# Patient Record
Sex: Female | Born: 1985
Health system: Southern US, Community
[De-identification: ages and names within clinical notes are randomized; demographics above are authoritative.]

## PROBLEM LIST (undated history)

## (undated) DIAGNOSIS — L309 Dermatitis, unspecified: Secondary | ICD-10-CM

## (undated) DIAGNOSIS — N83209 Unspecified ovarian cyst, unspecified side: Secondary | ICD-10-CM

## (undated) DIAGNOSIS — N809 Endometriosis, unspecified: Secondary | ICD-10-CM

## (undated) DIAGNOSIS — K8020 Calculus of gallbladder without cholecystitis without obstruction: Secondary | ICD-10-CM

## (undated) DIAGNOSIS — E785 Hyperlipidemia, unspecified: Secondary | ICD-10-CM

## (undated) DIAGNOSIS — N926 Irregular menstruation, unspecified: Secondary | ICD-10-CM

## (undated) DIAGNOSIS — L709 Acne, unspecified: Secondary | ICD-10-CM

## (undated) DIAGNOSIS — R102 Pelvic and perineal pain: Secondary | ICD-10-CM

## (undated) HISTORY — PX: CHOLECYSTECTOMY: SHX55

## (undated) HISTORY — PX: NO PAST SURGERIES: SHX2092

## (undated) HISTORY — DX: Calculus of gallbladder without cholecystitis without obstruction: K80.20

## (undated) HISTORY — DX: Endometriosis, unspecified: N80.9

## (undated) HISTORY — DX: Unspecified ovarian cyst, unspecified side: N83.209

---

## 2009-11-06 ENCOUNTER — Emergency Department (HOSPITAL_COMMUNITY): Admission: EM | Admit: 2009-11-06 | Discharge: 2009-11-06 | Payer: Self-pay | Admitting: Family Medicine

## 2010-05-15 ENCOUNTER — Inpatient Hospital Stay (INDEPENDENT_AMBULATORY_CARE_PROVIDER_SITE_OTHER)
Admission: RE | Admit: 2010-05-15 | Discharge: 2010-05-15 | Disposition: A | Discharge status: Home / Self Care | Payer: PPO | Source: Ambulatory Visit | Attending: Family Medicine | Admitting: Family Medicine

## 2010-05-15 DIAGNOSIS — G43009 Migraine without aura, not intractable, without status migrainosus: Secondary | ICD-10-CM

## 2011-08-26 ENCOUNTER — Emergency Department (HOSPITAL_COMMUNITY)
Admission: EM | Admit: 2011-08-26 | Discharge: 2011-08-27 | Disposition: A | Discharge status: Home / Self Care | Payer: PRIVATE HEALTH INSURANCE | Attending: Emergency Medicine | Admitting: Emergency Medicine

## 2011-08-26 ENCOUNTER — Encounter (HOSPITAL_COMMUNITY): Payer: Self-pay | Admitting: *Deleted

## 2011-08-26 DIAGNOSIS — H9209 Otalgia, unspecified ear: Secondary | ICD-10-CM | POA: Insufficient documentation

## 2011-08-26 DIAGNOSIS — H6690 Otitis media, unspecified, unspecified ear: Secondary | ICD-10-CM

## 2011-08-26 HISTORY — DX: Acne, unspecified: L70.9

## 2011-08-26 HISTORY — DX: Dermatitis, unspecified: L30.9

## 2011-08-26 NOTE — ED Notes (Signed)
C/o L ear pain & fullness, (denies: nv, drainage, dizziness or fever), onset Saturday.

## 2011-08-27 MED ORDER — AMOXICILLIN-POT CLAVULANATE 875-125 MG PO TABS: 1.0000 | ORAL_TABLET | Freq: Two times a day (BID) | ORAL | Status: AC

## 2011-08-27 MED ORDER — AMOXICILLIN-POT CLAVULANATE 875-125 MG PO TABS
1.0000 | ORAL_TABLET | ORAL | Status: AC
  Administered 2011-08-27: 1 via ORAL
  Filled 2011-08-27: qty 1

## 2011-08-27 MED ORDER — IBUPROFEN 200 MG PO TABS
600.0000 mg | ORAL_TABLET | Freq: Once | ORAL | Status: AC
  Administered 2011-08-27: 600 mg via ORAL
  Filled 2011-08-27: qty 3

## 2011-08-27 NOTE — ED Provider Notes (Signed)
History     CSN: [id number]  Arrival date & time 08/26/11  2348   First MD Initiated Contact with Patient 08/27/11 0111      Chief Complaint  Patient presents with  . Otalgia    L ear    (Consider location/radiation/quality/duration/timing/severity/associated sxs/prior treatment) HPI Comments: Patient states that last week.  She had a URI symptoms, now she's having extreme pain in her left ear without drainage  Patient is a 26 y.o. female presenting with ear pain. The history is provided by the patient.  Otalgia The current episode started 3 to 5 hours ago. There is pain in the left ear. The problem occurs constantly. The problem has not changed since onset.There has been no fever. Pertinent negatives include no ear discharge, no headaches and no hearing loss.    Past Medical History  Diagnosis Date  . Hypercholesterolemia   . Acne   . Eczema     Past Surgical History  Procedure Date  . Cardiac surgery     No family history on file.  History  Substance Use Topics  . Smoking status: Never Smoker   . Smokeless tobacco: Not on file  . Alcohol Use: No    OB History    Grav Para Term Preterm Abortions TAB SAB Ect Mult Living                  Review of Systems  Constitutional: Negative for fever.  HENT: Positive for ear pain and congestion. Negative for hearing loss and ear discharge.   Neurological: Negative for dizziness and headaches.    Allergies  Review of patient's allergies indicates no known allergies.  Home Medications   Current Outpatient Rx  Name Route Sig Dispense Refill  . AMOXICILLIN-POT CLAVULANATE 875-125 MG PO TABS Oral Take 1 tablet by mouth every 12 (twelve) hours. 14 tablet 0    BP 120/72  Pulse 87  Temp(Src) 99.1 F (37.3 C) (Oral)  Resp 16  Ht 5\' 6"  (1.676 m)  Wt 212 lb (96.163 kg)  BMI 34.22 kg/m2  SpO2 100%  LMP 08/12/2011  Physical Exam  Constitutional: She is oriented to person, place, and time. She appears  well-developed.  HENT:  Left Ear: External ear and ear canal normal. Tympanic membrane is bulging.  Eyes: Pupils are equal, round, and reactive to light.  Neck: Normal range of motion.  Cardiovascular: Normal rate.   Pulmonary/Chest: Effort normal.  Neurological: She is alert and oriented to person, place, and time.  Skin: Skin is warm.    ED Course  Procedures (including critical care time)  Labs Reviewed - No data to display No results found.   1. Otitis media       MDM   Left ear canal normal.  TM, red, and bulging.  No exudate in the canal        Arman Filter, NP 08/27/11 0134  Arman Filter, NP 08/27/11 8295

## 2011-08-27 NOTE — ED Provider Notes (Signed)
Medical screening examination/treatment/procedure(s) were performed by non-physician practitioner and as supervising physician I was immediately available for consultation/collaboration.  Geraldean Walen M Charon Akamine, MD 08/27/11 0446 

## 2011-08-27 NOTE — ED Notes (Signed)
Patient is AOx4 and comfortable with her discharge instructions. 

## 2011-08-27 NOTE — Discharge Instructions (Signed)
Otitis Media, Adult A middle ear infection is an infection in the space behind the eardrum. The medical name for this is "otitis media." It may happen after a common cold. It is caused by a germ that starts growing in that space. You may feel swollen glands in your neck on the side of the ear infection. HOME CARE INSTRUCTIONS   Take your medicine as directed until it is gone, even if you feel better after the first few days.   Only take over-the-counter or prescription medicines for pain, discomfort, or fever as directed by your caregiver.   Occasional use of a nasal decongestant a couple times per day may help with discomfort and help the eustachian tube to drain better.  Follow up with your caregiver in 10 to 14 days or as directed, to be certain that the infection has cleared. Not keeping the appointment could result in a chronic or permanent injury, pain, hearing loss and disability. If there is any problem keeping the appointment, you must call back to this facility for assistance. SEEK IMMEDIATE MEDICAL CARE IF:   You are not getting better in 2 to 3 days.   You have pain that is not controlled with medication.   You feel worse instead of better.   You cannot use the medication as directed.   You develop swelling, redness or pain around the ear or stiffness in your neck.  MAKE SURE YOU:   Understand these instructions.   Will watch your condition.   Will get help right away if you are not doing well or get worse.  Document Released: 12/27/2003 Document Revised: 03/12/2011 Document Reviewed: 10/28/2007 [facility] Patient Information 2012 [place], [place]. Take the antibiotic until completed

## 2012-05-13 ENCOUNTER — Encounter (HOSPITAL_COMMUNITY): Payer: Self-pay | Admitting: *Deleted

## 2012-05-13 ENCOUNTER — Emergency Department (HOSPITAL_COMMUNITY)
Admission: EM | Admit: 2012-05-13 | Discharge: 2012-05-13 | Disposition: A | Discharge status: Home / Self Care | Payer: PRIVATE HEALTH INSURANCE | Source: Home / Self Care

## 2012-05-13 DIAGNOSIS — J111 Influenza due to unidentified influenza virus with other respiratory manifestations: Secondary | ICD-10-CM

## 2012-05-13 MED ORDER — PHENYLEPHRINE-CHLORPHEN-DM 10-4-12.5 MG/5ML PO LIQD: 5.0000 mL | ORAL | Status: DC | PRN

## 2012-05-13 NOTE — ED Provider Notes (Signed)
History     CSN: [id number]  Arrival date & time 05/13/12  1002   First MD Initiated Contact with Patient 05/13/12 1020      Chief Complaint  Patient presents with  . Influenza    (Consider location/radiation/quality/duration/timing/severity/associated sxs/prior treatment) HPI Comments: 27 year old female complains of chest congestion, cough, sore throat, PND, feeling cold, bodyaches and abdominal pain starting 2 days ago. She denies nausea vomiting or diarrhea or earaches. No documented fever but she "felt hot".    Past Medical History  Diagnosis Date  . Hypercholesterolemia   . Acne   . Eczema     Past Surgical History  Procedure Date  . Cardiac surgery     Family History  Problem Relation Age of Onset  . Diabetes Brother   . Stroke Brother   . Heart failure Brother     History  Substance Use Topics  . Smoking status: Never Smoker   . Smokeless tobacco: Not on file  . Alcohol Use: No    OB History    Grav Para Term Preterm Abortions TAB SAB Ect Mult Living                  Review of Systems  Constitutional: Negative for fever, chills, activity change, appetite change and fatigue.  HENT: Positive for congestion, rhinorrhea and postnasal drip. Negative for facial swelling, neck pain and neck stiffness.   Eyes: Negative.   Respiratory: Positive for cough. Negative for wheezing.   Cardiovascular: Negative.   Gastrointestinal: Positive for abdominal pain.  Genitourinary: Negative.   Skin: Negative for pallor and rash.  Neurological: Negative.   Psychiatric/Behavioral: Negative.     Allergies  Review of patient's allergies indicates no known allergies.  Home Medications   Current Outpatient Rx  Name  Route  Sig  Dispense  Refill  . PHENYLEPHRINE-CHLORPHEN-DM 01-08-11.5 MG/5ML PO LIQD   Oral   Take 5 mLs by mouth every 4 (four) hours as needed.   120 mL   0     BP 127/67  Pulse 95  Temp 99.6 F (37.6 C) (Oral)  Resp 20  SpO2 98%  LMP  05/03/2012  Physical Exam  Nursing note and vitals reviewed. Constitutional: She is oriented to person, place, and time. She appears well-developed and well-nourished. No distress.  HENT:       Bilateral TMs are normal Oropharynx is erythematous with clear PND.  Eyes: Conjunctivae normal and EOM are normal.  Neck: Normal range of motion. Neck supple.  Cardiovascular: Normal rate, regular rhythm and normal heart sounds.   Pulmonary/Chest: Effort normal and breath sounds normal. No respiratory distress. She has no wheezes. She has no rales.  Abdominal: Soft. Bowel sounds are normal. She exhibits no mass. There is no tenderness. There is no rebound and no guarding.  Musculoskeletal: Normal range of motion. She exhibits no edema.  Lymphadenopathy:    She has no cervical adenopathy.  Neurological: She is alert and oriented to person, place, and time.  Skin: Skin is warm and dry. No rash noted.  Psychiatric: She has a normal mood and affect.    ED Course  Procedures (including critical care time)  Labs Reviewed - No data to display No results found.   1. Influenza-like illness       MDM  Patient has a flulike illness primarily with upper respiratory symptoms. No distress is stable at discharge. She is instructed to drink plenty of fluids and stay well-hydrated Home and rest for the next  couple of day Norell CS as directed for URI symptoms Tylenol or Advil as directed for fever and discomfort.        Hayden Rasmussen, NP 05/13/12 1049

## 2012-05-13 NOTE — ED Provider Notes (Signed)
Medical screening examination/treatment/procedure(s) were performed by resident physician or non-physician practitioner and as supervising physician I was immediately available for consultation/collaboration.   Barkley Bruns MD.    Linna Hoff, MD 05/13/12 [phone]

## 2012-05-13 NOTE — ED Notes (Signed)
Pt reports body aches, headaches, cough, congestion,nausea  Since Wednesday - pt also reports that boss is sick with flu

## 2013-03-13 ENCOUNTER — Emergency Department (INDEPENDENT_AMBULATORY_CARE_PROVIDER_SITE_OTHER)
Admission: EM | Admit: 2013-03-13 | Discharge: 2013-03-13 | Disposition: A | Discharge status: Home / Self Care | Payer: Self-pay | Source: Home / Self Care | Attending: Family Medicine | Admitting: Family Medicine

## 2013-03-13 ENCOUNTER — Encounter (HOSPITAL_COMMUNITY): Payer: Self-pay | Admitting: Emergency Medicine

## 2013-03-13 DIAGNOSIS — K5289 Other specified noninfective gastroenteritis and colitis: Secondary | ICD-10-CM

## 2013-03-13 DIAGNOSIS — K529 Noninfective gastroenteritis and colitis, unspecified: Secondary | ICD-10-CM

## 2013-03-13 LAB — POCT I-STAT, CHEM 8
BUN: 4 mg/dL — ABNORMAL LOW (ref 6–23)
Creatinine, Ser: 0.9 mg/dL (ref 0.50–1.10)
Potassium: 3.8 mEq/L (ref 3.5–5.1)
Sodium: 142 mEq/L (ref 135–145)
TCO2: 26 mmol/L (ref 0–100)

## 2013-03-13 MED ORDER — ONDANSETRON HCL 4 MG PO TABS: 4.0000 mg | ORAL_TABLET | Freq: Four times a day (QID) | ORAL | Status: DC

## 2013-03-13 MED ORDER — ONDANSETRON 4 MG PO TBDP
8.0000 mg | ORAL_TABLET | Freq: Once | ORAL | Status: AC
  Administered 2013-03-13: 8 mg via ORAL

## 2013-03-13 MED ORDER — ONDANSETRON 4 MG PO TBDP
ORAL_TABLET | ORAL | Status: AC
  Filled 2013-03-13: qty 2

## 2013-03-13 NOTE — ED Notes (Signed)
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

## 2013-03-13 NOTE — ED Notes (Signed)
Bed: UC06 Expected date: 03/13/13 Expected time: 2:20 PM Means of arrival:  Comments:

## 2013-03-13 NOTE — ED Provider Notes (Signed)
CSN: [id number]     Arrival date & time 03/13/13  1425 History   First MD Initiated Contact with Patient 03/13/13 1444     Chief Complaint  Patient presents with  . Headache  . Dizziness   (Consider location/radiation/quality/duration/timing/severity/associated sxs/prior Treatment) Patient is a 27 y.o. female presenting with diarrhea. The history is provided by the patient.  Diarrhea Quality:  Watery Severity:  Mild Onset quality:  Gradual Duration:  1 week Progression:  Unchanged Associated symptoms: headaches   Associated symptoms: no fever and no vomiting   Associated symptoms comment:  Dizziness and nausea. Denies pregnancy.   Past Medical History  Diagnosis Date  . Hypercholesterolemia   . Acne   . Eczema    Past Surgical History  Procedure Laterality Date  . Cardiac surgery     Family History  Problem Relation Age of Onset  . Diabetes Brother   . Stroke Brother   . Heart failure Brother    History  Substance Use Topics  . Smoking status: Never Smoker   . Smokeless tobacco: Not on file  . Alcohol Use: No   OB History   Grav Para Term Preterm Abortions TAB SAB Ect Mult Living                 Review of Systems  Constitutional: Negative for fever.  Gastrointestinal: Positive for nausea and diarrhea. Negative for vomiting, blood in stool and anal bleeding.  Genitourinary: Negative.   Neurological: Positive for dizziness, weakness and headaches.    Allergies  Review of patient's allergies indicates no known allergies.  Home Medications   Current Outpatient Rx  Name  Route  Sig  Dispense  Refill  . ondansetron (ZOFRAN) 4 MG tablet   Oral   Take 1 tablet (4 mg total) by mouth every 6 (six) hours.   8 tablet   0   . Phenylephrine-Chlorphen-DM 01-08-11.5 MG/5ML LIQD   Oral   Take 5 mLs by mouth every 4 (four) hours as needed.   120 mL   0    BP 125/78  Pulse 72  Temp(Src) 98.1 F (36.7 C) (Oral)  Resp 20  SpO2 98%  LMP 02/20/2013 Physical  Exam  Nursing note and vitals reviewed. Constitutional: She is oriented to person, place, and time. She appears well-developed and well-nourished.  HENT:  Mouth/Throat: Oropharynx is clear and moist.  Eyes: Conjunctivae and EOM are normal. Pupils are equal, round, and reactive to light.  Neck: Normal range of motion. Neck supple.  Pulmonary/Chest: Breath sounds normal.  Abdominal: Soft. Normal appearance and bowel sounds are normal. She exhibits no distension and no mass. There is generalized tenderness. There is no rigidity, no rebound, no guarding, no CVA tenderness and no tenderness at McBurney's point.  Lymphadenopathy:    She has no cervical adenopathy.  Neurological: She is alert and oriented to person, place, and time.  Skin: Skin is warm and dry.    ED Course  Procedures (including critical care time) Labs Review Labs Reviewed  POCT I-STAT, CHEM 8 - Abnormal; Notable for the following:    BUN 4 (*)    Calcium, Ion 1.32 (*)    Hemoglobin 16.7 (*)    HCT 49.0 (*)    All other components within normal limits   Imaging Review No results found.  EKG Interpretation    Date/Time:    Ventricular Rate:    PR Interval:    QRS Duration:   QT Interval:    QTC Calculation:  R Axis:     Text Interpretation:              MDM  i-stat wnl.    Linna Hoff, MD 03/13/13 [phone]

## 2013-03-31 ENCOUNTER — Encounter (HOSPITAL_COMMUNITY): Payer: Self-pay | Admitting: Emergency Medicine

## 2013-03-31 ENCOUNTER — Emergency Department (INDEPENDENT_AMBULATORY_CARE_PROVIDER_SITE_OTHER)
Admission: EM | Admit: 2013-03-31 | Discharge: 2013-03-31 | Disposition: A | Discharge status: Home / Self Care | Payer: Self-pay | Source: Home / Self Care | Attending: Family Medicine | Admitting: Family Medicine

## 2013-03-31 DIAGNOSIS — J111 Influenza due to unidentified influenza virus with other respiratory manifestations: Secondary | ICD-10-CM

## 2013-03-31 MED ORDER — ONDANSETRON 4 MG PO TBDP
ORAL_TABLET | ORAL | Status: AC
  Filled 2013-03-31: qty 1

## 2013-03-31 MED ORDER — OSELTAMIVIR PHOSPHATE 75 MG PO CAPS: 75.0000 mg | ORAL_CAPSULE | Freq: Two times a day (BID) | ORAL | Status: DC

## 2013-03-31 MED ORDER — ONDANSETRON 4 MG PO TBDP
4.0000 mg | ORAL_TABLET | Freq: Once | ORAL | Status: AC
  Administered 2013-03-31: 4 mg via ORAL

## 2013-03-31 NOTE — ED Notes (Signed)
Headache, chills, runny nose, sore throat, onset Wednesday night

## 2013-03-31 NOTE — ED Notes (Signed)
Patient requested work note, provided to patient

## 2013-03-31 NOTE — ED Provider Notes (Signed)
CSN: [id number]     Arrival date & time 03/31/13  1043 History   First MD Initiated Contact with Patient 03/31/13 1152     Chief Complaint  Patient presents with  . Headache  . Sore Throat   (Consider location/radiation/quality/duration/timing/severity/associated sxs/prior Treatment) Patient is a 27 y.o. female presenting with pharyngitis and URI. The history is provided by the patient.  Sore Throat  URI Presenting symptoms: congestion, cough, fever and rhinorrhea   Severity:  Mild Onset quality:  Sudden Duration:  1 day Progression:  Waxing and waning Chronicity:  New Associated symptoms: myalgias   Risk factors: sick contacts     Past Medical History  Diagnosis Date  . Hypercholesterolemia   . Acne   . Eczema    Past Surgical History  Procedure Laterality Date  . Cardiac surgery     Family History  Problem Relation Age of Onset  . Diabetes Brother   . Stroke Brother   . Heart failure Brother    History  Substance Use Topics  . Smoking status: Never Smoker   . Smokeless tobacco: Not on file  . Alcohol Use: No   OB History   Grav Para Term Preterm Abortions TAB SAB Ect Mult Living                 Review of Systems  Constitutional: Positive for fever, chills and appetite change.  HENT: Positive for congestion and rhinorrhea.   Respiratory: Positive for cough.   Gastrointestinal: Positive for diarrhea. Negative for blood in stool.  Genitourinary: Negative.   Musculoskeletal: Positive for myalgias.    Allergies  Review of patient's allergies indicates no known allergies.  Home Medications   Current Outpatient Rx  Name  Route  Sig  Dispense  Refill  . ondansetron (ZOFRAN) 4 MG tablet   Oral   Take 1 tablet (4 mg total) by mouth every 6 (six) hours.   8 tablet   0   . oseltamivir (TAMIFLU) 75 MG capsule   Oral   Take 1 capsule (75 mg total) by mouth every 12 (twelve) hours. Take all of medication.   10 capsule   0   .  Phenylephrine-Chlorphen-DM 01-08-11.5 MG/5ML LIQD   Oral   Take 5 mLs by mouth every 4 (four) hours as needed.   120 mL   0    BP 109/76  Pulse 74  Temp(Src) 98.1 F (36.7 C) (Oral)  Resp 16  Ht 5' 6.5" (1.689 m)  Wt 189 lb (85.73 kg)  BMI 30.05 kg/m2  SpO2 99%  LMP 03/24/2013 Physical Exam  Nursing note and vitals reviewed. Constitutional: She is oriented to person, place, and time. She appears well-developed and well-nourished.  HENT:  Head: Normocephalic.  Right Ear: External ear normal.  Left Ear: External ear normal.  Mouth/Throat: Oropharynx is clear and moist.  Eyes: Pupils are equal, round, and reactive to light.  Neck: Normal range of motion. Neck supple.  Cardiovascular: Normal rate, regular rhythm, normal heart sounds and intact distal pulses.   Pulmonary/Chest: Effort normal and breath sounds normal.  Abdominal: Soft. Bowel sounds are normal. She exhibits no distension. There is no tenderness. There is no rebound.  Lymphadenopathy:    She has no cervical adenopathy.  Neurological: She is alert and oriented to person, place, and time.  Skin: Skin is warm and dry.    ED Course  Procedures (including critical care time) Labs Review Labs Reviewed - No data to display Imaging Review No results  found.  EKG Interpretation    Date/Time:    Ventricular Rate:    PR Interval:    QRS Duration:   QT Interval:    QTC Calculation:   R Axis:     Text Interpretation:              MDM      Linna Hoff, MD 03/31/13 1214

## 2013-04-03 ENCOUNTER — Encounter (HOSPITAL_COMMUNITY): Payer: Self-pay | Admitting: Emergency Medicine

## 2013-04-03 ENCOUNTER — Emergency Department (INDEPENDENT_AMBULATORY_CARE_PROVIDER_SITE_OTHER)
Admission: EM | Admit: 2013-04-03 | Discharge: 2013-04-03 | Disposition: A | Discharge status: Home / Self Care | Payer: Self-pay | Source: Home / Self Care | Attending: Emergency Medicine | Admitting: Emergency Medicine

## 2013-04-03 DIAGNOSIS — R51 Headache: Secondary | ICD-10-CM

## 2013-04-03 DIAGNOSIS — R519 Headache, unspecified: Secondary | ICD-10-CM

## 2013-04-03 DIAGNOSIS — J069 Acute upper respiratory infection, unspecified: Secondary | ICD-10-CM

## 2013-04-03 MED ORDER — PROMETHAZINE HCL 25 MG PO TABS: 25.0000 mg | ORAL_TABLET | Freq: Four times a day (QID) | ORAL | Status: DC | PRN

## 2013-04-03 MED ORDER — HYDROCODONE-HOMATROPINE 5-1.5 MG/5ML PO SYRP: 5.0000 mL | ORAL_SOLUTION | Freq: Four times a day (QID) | ORAL | Status: DC | PRN

## 2013-04-03 MED ORDER — PREDNISONE 20 MG PO TABS: ORAL_TABLET | ORAL | Status: DC

## 2013-04-03 NOTE — ED Notes (Signed)
Pt c/o persistent cough, HA, BA, nauseas... Sen here on 12/26 for similar sxs... Given Tamiflu but did not p/u b/c it was too expensive Has been treating w/Theraflu and Ibup She is alert w/no signs of acute distress.

## 2013-04-03 NOTE — ED Provider Notes (Signed)
CSN: [id number]     Arrival date & time 04/03/13  1305 History   First MD Initiated Contact with Patient 04/03/13 1543     Chief Complaint  Patient presents with  . URI   (Consider location/radiation/quality/duration/timing/severity/associated sxs/prior Treatment) HPI Comments: 22f presents c/o cough, headache, body aches.  She was seen here 3 days ago and was diagnosed with influenza. She was not able to afford the Tamiflu she was prescribed. Since that time, she has not gotten any better. Her workup so that she could return to work today but she did not feel well enough to return to his ear requesting a new work note. Her cough has been productive. She also admits to some subjective chills and fatigue. She denies any chest pain or shortness of breath. She has nausea but no vomiting or diarrhea. She also admits to some mild diffuse abdominal pain, 0-1/10 in severity.  Patient is a 27 y.o. female presenting with URI.  URI Presenting symptoms: congestion, fatigue, rhinorrhea and sore throat   Presenting symptoms: no cough, no ear pain and no fever   Associated symptoms: headaches   Associated symptoms: no arthralgias, no myalgias and no wheezing     Past Medical History  Diagnosis Date  . Hypercholesterolemia   . Acne   . Eczema    Past Surgical History  Procedure Laterality Date  . Cardiac surgery     Family History  Problem Relation Age of Onset  . Diabetes Brother   . Stroke Brother   . Heart failure Brother    History  Substance Use Topics  . Smoking status: Never Smoker   . Smokeless tobacco: Not on file  . Alcohol Use: No   OB History   Grav Para Term Preterm Abortions TAB SAB Ect Mult Living                 Review of Systems  Constitutional: Positive for chills and fatigue. Negative for fever.  HENT: Positive for congestion, rhinorrhea, sinus pressure and sore throat. Negative for ear pain.   Eyes: Negative for visual disturbance.  Respiratory: Negative for  cough, chest tightness, shortness of breath and wheezing.   Cardiovascular: Negative for chest pain, palpitations and leg swelling.  Gastrointestinal: Negative for nausea, vomiting and abdominal pain.  Endocrine: Negative for polydipsia and polyuria.  Genitourinary: Negative for dysuria, urgency and frequency.  Musculoskeletal: Negative for arthralgias and myalgias.  Skin: Negative for rash.  Neurological: Positive for headaches. Negative for dizziness, weakness and light-headedness.    Allergies  Review of patient's allergies indicates no known allergies.  Home Medications   Current Outpatient Rx  Name  Route  Sig  Dispense  Refill  . HYDROcodone-homatropine (HYCODAN) 5-1.5 MG/5ML syrup   Oral   Take 5 mLs by mouth every 6 (six) hours as needed for cough.   120 mL   0   . ondansetron (ZOFRAN) 4 MG tablet   Oral   Take 1 tablet (4 mg total) by mouth every 6 (six) hours.   8 tablet   0   . oseltamivir (TAMIFLU) 75 MG capsule   Oral   Take 1 capsule (75 mg total) by mouth every 12 (twelve) hours. Take all of medication.   10 capsule   0   . Phenylephrine-Chlorphen-DM 01-08-11.5 MG/5ML LIQD   Oral   Take 5 mLs by mouth every 4 (four) hours as needed.   120 mL   0   . predniSONE (DELTASONE) 20 MG tablet  2 tablets daily for 3 days; 1 tablet daily for 3 days; 1/2 tablet daily for 3 days   11 tablet   0   . promethazine (PHENERGAN) 25 MG tablet   Oral   Take 1 tablet (25 mg total) by mouth every 6 (six) hours as needed for nausea or vomiting.   12 tablet   0    BP 128/61  Pulse 89  Temp(Src) 98 F (36.7 C) (Oral)  Resp 16  SpO2 98%  LMP 03/24/2013 Physical Exam  Nursing note and vitals reviewed. Constitutional: She is oriented to person, place, and time. Vital signs are normal. She appears well-developed and well-nourished. No distress.  HENT:  Head: Normocephalic and atraumatic.  Right Ear: External ear normal.  Left Ear: External ear normal.  Nose:  Nose normal.  Mouth/Throat: Oropharynx is clear and moist. No oropharyngeal exudate.  Neck: Normal range of motion.  Cardiovascular: Normal rate, regular rhythm and normal heart sounds.  Exam reveals no gallop and no friction rub.   No murmur heard. Pulmonary/Chest: Effort normal and breath sounds normal. No respiratory distress. She has no wheezes. She has no rales.  Abdominal: Soft.  Lymphadenopathy:    She has no cervical adenopathy.  Neurological: She is alert and oriented to person, place, and time. She has normal strength. Coordination normal.  Skin: Skin is warm and dry. No rash noted. She is not diaphoretic.  Psychiatric: She has a normal mood and affect. Judgment normal.    ED Course  Procedures (including critical care time) Labs Review Labs Reviewed - No data to display Imaging Review No results found.    MDM   1. URI (upper respiratory infection)   2. Headache    Exam is completely normal. Her symptoms can still be explained by influenza. Treat symptomatically. Reviewed symptoms of pneumonia in detail with this patient, if she starts to experience these she will followup again.   Meds ordered this encounter  Medications  . predniSONE (DELTASONE) 20 MG tablet    Sig: 2 tablets daily for 3 days; 1 tablet daily for 3 days; 1/2 tablet daily for 3 days    Dispense:  11 tablet    Refill:  0    Order Specific Question:  Supervising Provider    Answer:  Lorenz Coaster, DAVID C [id number]  . HYDROcodone-homatropine (HYCODAN) 5-1.5 MG/5ML syrup    Sig: Take 5 mLs by mouth every 6 (six) hours as needed for cough.    Dispense:  120 mL    Refill:  0    Order Specific Question:  Supervising Provider    Answer:  Lorenz Coaster, DAVID C [id number]  . promethazine (PHENERGAN) 25 MG tablet    Sig: Take 1 tablet (25 mg total) by mouth every 6 (six) hours as needed for nausea or vomiting.    Dispense:  12 tablet    Refill:  0    Order Specific Question:  Supervising Provider    Answer:  Lorenz Coaster,  DAVID C [[id number]]       Graylon Good, PA-C 04/03/13 [phone]

## 2013-04-04 NOTE — ED Provider Notes (Signed)
Medical screening examination/treatment/procedure(s) were performed by non-physician practitioner and as supervising physician I was immediately available for consultation/collaboration.  Leslee Home, M.D.  Reuben Likes, MD 04/04/13 [phone]

## 2013-05-24 ENCOUNTER — Encounter (HOSPITAL_COMMUNITY): Payer: Self-pay | Admitting: Emergency Medicine

## 2013-05-24 ENCOUNTER — Emergency Department (INDEPENDENT_AMBULATORY_CARE_PROVIDER_SITE_OTHER)
Admission: EM | Admit: 2013-05-24 | Discharge: 2013-05-24 | Disposition: A | Discharge status: Home / Self Care | Payer: PRIVATE HEALTH INSURANCE | Source: Home / Self Care

## 2013-05-24 DIAGNOSIS — B349 Viral infection, unspecified: Secondary | ICD-10-CM

## 2013-05-24 DIAGNOSIS — J069 Acute upper respiratory infection, unspecified: Secondary | ICD-10-CM

## 2013-05-24 DIAGNOSIS — R197 Diarrhea, unspecified: Secondary | ICD-10-CM

## 2013-05-24 DIAGNOSIS — B9789 Other viral agents as the cause of diseases classified elsewhere: Secondary | ICD-10-CM

## 2013-05-24 MED ORDER — ONDANSETRON HCL 4 MG PO TABS: 4.0000 mg | ORAL_TABLET | Freq: Three times a day (TID) | ORAL | Status: DC | PRN

## 2013-05-24 NOTE — ED Provider Notes (Signed)
CSN: [id number]     Arrival date & time 05/24/13  6237 History   First MD Initiated Contact with Patient 05/24/13 0935     Chief Complaint  Patient presents with  . URI     (Consider location/radiation/quality/duration/timing/severity/associated sxs/prior Treatment) HPI Comments: 28 year old female complaining of diarrhea that started approximately 2 days ago. In the past 24 hours she said 3-4 diarrhea stools. She is also complaining of a headache, body aches, sore throat, chills abdominal discomfort and malaise. Denies earache. She states her temperature at home was 100.6. She has been taking NyQuil caplets with little to no relief.   Past Medical History  Diagnosis Date  . Hypercholesterolemia   . Acne   . Eczema    Past Surgical History  Procedure Laterality Date  . Cardiac surgery     Family History  Problem Relation Age of Onset  . Diabetes Brother   . Stroke Brother   . Heart failure Brother    History  Substance Use Topics  . Smoking status: Never Smoker   . Smokeless tobacco: Not on file  . Alcohol Use: No   OB History   Grav Para Term Preterm Abortions TAB SAB Ect Mult Living                 Review of Systems  Constitutional: Positive for fever, activity change and fatigue. Negative for chills and appetite change.  HENT: Positive for congestion, postnasal drip and rhinorrhea. Negative for facial swelling.   Eyes: Negative.   Respiratory: Positive for cough and shortness of breath.   Cardiovascular: Negative.   Gastrointestinal: Positive for nausea and diarrhea. Negative for vomiting.  Genitourinary: Negative.   Musculoskeletal: Positive for myalgias. Negative for neck pain and neck stiffness.  Skin: Negative for pallor and rash.  Neurological: Positive for headaches.      Allergies  Review of patient's allergies indicates no known allergies.  Home Medications   Current Outpatient Rx  Name  Route  Sig  Dispense  Refill  . HYDROcodone-homatropine  (HYCODAN) 5-1.5 MG/5ML syrup   Oral   Take 5 mLs by mouth every 6 (six) hours as needed for cough.   120 mL   0   . ondansetron (ZOFRAN) 4 MG tablet   Oral   Take 1 tablet (4 mg total) by mouth every 6 (six) hours.   8 tablet   0   . ondansetron (ZOFRAN) 4 MG tablet   Oral   Take 1 tablet (4 mg total) by mouth every 8 (eight) hours as needed for nausea. May cause constipation   12 tablet   0   . oseltamivir (TAMIFLU) 75 MG capsule   Oral   Take 1 capsule (75 mg total) by mouth every 12 (twelve) hours. Take all of medication.   10 capsule   0   . Phenylephrine-Chlorphen-DM 01-08-11.5 MG/5ML LIQD   Oral   Take 5 mLs by mouth every 4 (four) hours as needed.   120 mL   0   . predniSONE (DELTASONE) 20 MG tablet      2 tablets daily for 3 days; 1 tablet daily for 3 days; 1/2 tablet daily for 3 days   11 tablet   0   . promethazine (PHENERGAN) 25 MG tablet   Oral   Take 1 tablet (25 mg total) by mouth every 6 (six) hours as needed for nausea or vomiting.   12 tablet   0    BP 119/81  Pulse 95  Temp(Src) 98 F (36.7 C) (Oral)  Resp 18  SpO2 99%  LMP 05/19/2013 Physical Exam  Nursing note and vitals reviewed. Constitutional: She is oriented to person, place, and time. She appears well-developed and well-nourished. No distress.  HENT:  Mouth/Throat: No oropharyngeal exudate.  Left TM is normal, right TM mildly retracted and partially exterior with cerumen. Oropharynx essentially normal with minor injection.  Eyes: Conjunctivae and EOM are normal.  Neck: Normal range of motion. Neck supple.  Cardiovascular: Normal rate and regular rhythm.   Pulmonary/Chest: Effort normal and breath sounds normal. No respiratory distress. She has no wheezes. She has no rales.  Abdominal: Soft. She exhibits no distension. There is no tenderness. There is no rebound and no guarding.  Musculoskeletal: She exhibits no edema and no tenderness.  Lymphadenopathy:    She has no cervical  adenopathy.  Neurological: She is alert and oriented to person, place, and time.  Skin: Skin is warm and dry.  Psychiatric: She has a normal mood and affect.    ED Course  Procedures (including critical care time) Labs Review Labs Reviewed - No data to display Imaging Review No results found.    MDM   Final diagnoses:  Viral syndrome  URI (upper respiratory infection)  Diarrhea    Patient's physical exam is essentially normal but her symptoms are consistent with a viral syndrome/URI. We will treat her nausea with Zofran. She may take Imodium AD one every 6 hours when necessary diarrhea greater than 3 times per 24 hours. She should not take tonight and to the point where she stopped her diarrhea. Treatment plan clear liquids Alka-Seltzer cold plus nighttime Ibuprofen and/or Tylenol when necessary    Janne Napoleon, NP 05/24/13 [phone]

## 2013-05-24 NOTE — Discharge Instructions (Signed)
Diet for Diarrhea, Adult Frequent, runny stools (diarrhea) may be caused or worsened by food or drink. Diarrhea may be relieved by changing your diet. Since diarrhea can last up to 7 days, it is easy for you to lose too much fluid from the body and become dehydrated. Fluids that are lost need to be replaced. Along with a modified diet, make sure you drink enough fluids to keep your urine clear or pale yellow. DIET INSTRUCTIONS  Ensure adequate fluid intake (hydration): have 1 cup (8 oz) of fluid for each diarrhea episode. Avoid fluids that contain simple sugars or sports drinks, fruit juices, whole milk products, and sodas. Your urine should be clear or pale yellow if you are drinking enough fluids. Hydrate with an oral rehydration solution that you can purchase at pharmacies, retail stores, and online. You can prepare an oral rehydration solution at home by mixing the following ingredients together:    tsp table salt.   tsp baking soda.   tsp salt substitute containing potassium chloride.  1  tablespoons sugar.  1 L (34 oz) of water.  Certain foods and beverages may increase the speed at which food moves through the gastrointestinal (GI) tract. These foods and beverages should be avoided and include:  Caffeinated and alcoholic beverages.  High-fiber foods, such as raw fruits and vegetables, nuts, seeds, and whole grain breads and cereals.  Foods and beverages sweetened with sugar alcohols, such as xylitol, sorbitol, and mannitol.  Some foods may be well tolerated and may help thicken stool including:  Starchy foods, such as rice, toast, pasta, low-sugar cereal, oatmeal, grits, baked potatoes, crackers, and bagels.   Bananas.   Applesauce.  Add probiotic-rich foods to help increase healthy bacteria in the GI tract, such as yogurt and fermented milk products. RECOMMENDED FOODS AND BEVERAGES Starches Choose foods with less than 2 g of fiber per serving.  Recommended:  White,  [place], and pita breads, plain rolls, buns, bagels. Plain muffins, matzo. Soda, saltine, or graham crackers. Pretzels, melba toast, zwieback. Cooked cereals made with water: cornmeal, farina, cream cereals. Dry cereals: refined corn, wheat, rice. Potatoes prepared any way without skins, refined macaroni, spaghetti, noodles, refined rice.  Avoid:  Bread, rolls, or crackers made with whole wheat, multi-grains, rye, bran seeds, nuts, or coconut. Corn tortillas or taco shells. Cereals containing whole grains, multi-grains, bran, coconut, nuts, raisins. Cooked or dry oatmeal. Coarse wheat cereals, granola. Cereals advertised as "high-fiber." Potato skins. Whole grain pasta, wild or brown rice. Popcorn. Sweet potatoes, yams. Sweet rolls, doughnuts, waffles, pancakes, sweet breads. Vegetables  Recommended: Strained tomato and vegetable juices. Most well-cooked and canned vegetables without seeds. Fresh: Tender lettuce, cucumber without the skin, cabbage, spinach, bean sprouts.  Avoid: Fresh, cooked, or canned: Artichokes, baked beans, beet greens, broccoli, Brussels sprouts, corn, kale, legumes, peas, sweet potatoes. Cooked: Green or red cabbage, spinach. Avoid large servings of any vegetables because vegetables shrink when cooked, and they contain more fiber per serving than fresh vegetables. Fruit  Recommended: Cooked or canned: Apricots, applesauce, cantaloupe, cherries, fruit cocktail, grapefruit, grapes, kiwi, mandarin oranges, peaches, pears, plums, watermelon. Fresh: Apples without skin, ripe banana, grapes, cantaloupe, cherries, grapefruit, peaches, oranges, plums. Keep servings limited to  cup or 1 piece.  Avoid: Fresh: Apples with skin, apricots, mangoes, pears, raspberries, strawberries. Prune juice, stewed or dried prunes. Dried fruits, raisins, dates. Large servings of all fresh fruits. Protein  Recommended: Ground or well-cooked tender beef, ham, veal, lamb, pork, or poultry. Eggs. Fish,  oysters, shrimp,  lobster, other seafoods. Liver, organ meats.  Avoid: Tough, fibrous meats with gristle. Peanut butter, smooth or chunky. Cheese, nuts, seeds, legumes, dried peas, beans, lentils. Dairy  Recommended: Yogurt, lactose-free milk, kefir, drinkable yogurt, buttermilk, soy milk, or plain hard cheese.  Avoid: Milk, chocolate milk, beverages made with milk, such as milkshakes. Soups  Recommended: Bouillon, broth, or soups made from allowed foods. Any strained soup.  Avoid: Soups made from vegetables that are not allowed, cream or milk-based soups. Desserts and Sweets  Recommended: Sugar-free gelatin, sugar-free frozen ice pops made without sugar alcohol.  Avoid: Plain cakes and cookies, pie made with fruit, pudding, custard, cream pie. Gelatin, fruit, ice, sherbet, frozen ice pops. Ice cream, ice milk without nuts. Plain hard candy, honey, jelly, molasses, syrup, sugar, chocolate syrup, gumdrops, marshmallows. Fats and Oils  Recommended: Limit fats to less than 8 tsp per day.  Avoid: Seeds, nuts, olives, avocados. Margarine, butter, cream, mayonnaise, salad oils, plain salad dressings. Plain gravy, crisp bacon without rind. Beverages  Recommended: Water, decaffeinated teas, oral rehydration solutions, sugar-free beverages not sweetened with sugar alcohols.  Avoid: Fruit juices, caffeinated beverages (coffee, tea, soda), alcohol, sports drinks, or lemon-lime soda. Condiments  Recommended: Ketchup, mustard, horseradish, vinegar, cocoa powder. Spices in moderation: allspice, basil, bay leaves, celery powder or leaves, cinnamon, cumin powder, curry powder, ginger, mace, marjoram, onion or garlic powder, oregano, paprika, parsley flakes, ground pepper, rosemary, sage, savory, tarragon, thyme, turmeric.  Avoid: Coconut, honey. Document Released: 06/13/2003 Document Revised: 12/16/2011 Document Reviewed: 08/07/2011 [facility] Patient Information 2014 [place].  Nausea,  Adult Nausea is the feeling that you have an upset stomach or have to vomit. Nausea by itself is not likely a serious concern, but it may be an early sign of more serious medical problems. As nausea gets worse, it can lead to vomiting. If vomiting develops, there is the risk of dehydration.  CAUSES   Viral infections.  Food poisoning.  Medicines.  Pregnancy.  Motion sickness.  Migraine headaches.  Emotional distress.  Severe pain from any source.  Alcohol intoxication. HOME CARE INSTRUCTIONS  Get plenty of rest.  Ask your caregiver about specific rehydration instructions.  Eat small amounts of food and sip liquids more often.  Take all medicines as told by your caregiver. SEEK MEDICAL CARE IF:  You have not improved after 2 days, or you get worse.  You have a headache. SEEK IMMEDIATE MEDICAL CARE IF:   You have a fever.  You faint.  You keep vomiting or have blood in your vomit.  You are extremely weak or dehydrated.  You have dark or bloody stools.  You have severe chest or abdominal pain. MAKE SURE YOU:  Understand these instructions.  Will watch your condition.  Will get help right away if you are not doing well or get worse. Document Released: 04/30/2004 Document Revised: 12/16/2011 Document Reviewed: 12/03/2010 [facility] Patient Information 2014 [place], [place].  Upper Respiratory Infection, Adult An upper respiratory infection (URI) is also sometimes known as the common cold. The upper respiratory tract includes the nose, sinuses, throat, trachea, and bronchi. Bronchi are the airways leading to the lungs. Most people improve within 1 week, but symptoms can last up to 2 weeks. A residual cough may last even longer.  CAUSES Many different viruses can infect the tissues lining the upper respiratory tract. The tissues become irritated and inflamed and often become very moist. Mucus production is also common. A cold is contagious. You can easily spread the  virus to others  by oral contact. This includes kissing, sharing a glass, coughing, or sneezing. Touching your mouth or nose and then touching a surface, which is then touched by another person, can also spread the virus. SYMPTOMS  Symptoms typically develop 1 to 3 days after you come in contact with a cold virus. Symptoms vary from person to person. They may include:  Runny nose.  Sneezing.  Nasal congestion.  Sinus irritation.  Sore throat.  Loss of voice (laryngitis).  Cough.  Fatigue.  Muscle aches.  Loss of appetite.  Headache.  Low-grade fever. DIAGNOSIS  You might diagnose your own cold based on familiar symptoms, since most people get a cold 2 to 3 times a year. Your caregiver can confirm this based on your exam. Most importantly, your caregiver can check that your symptoms are not due to another disease such as strep throat, sinusitis, pneumonia, asthma, or epiglottitis. Blood tests, throat tests, and X-rays are not necessary to diagnose a common cold, but they may sometimes be helpful in excluding other more serious diseases. Your caregiver will decide if any further tests are required. RISKS AND COMPLICATIONS  You may be at risk for a more severe case of the common cold if you smoke cigarettes, have chronic heart disease (such as heart failure) or lung disease (such as asthma), or if you have a weakened immune system. The very young and very old are also at risk for more serious infections. Bacterial sinusitis, middle ear infections, and bacterial pneumonia can complicate the common cold. The common cold can worsen asthma and chronic obstructive pulmonary disease (COPD). Sometimes, these complications can require emergency medical care and may be life-threatening. PREVENTION  The best way to protect against getting a cold is to practice good hygiene. Avoid oral or hand contact with people with cold symptoms. Wash your hands often if contact occurs. There is no clear evidence  that vitamin C, vitamin E, echinacea, or exercise reduces the chance of developing a cold. However, it is always recommended to get plenty of rest and practice good nutrition. TREATMENT  Treatment is directed at relieving symptoms. There is no cure. Antibiotics are not effective, because the infection is caused by a virus, not by bacteria. Treatment may include:  Increased fluid intake. Sports drinks offer valuable electrolytes, sugars, and fluids.  Breathing heated mist or steam (vaporizer or shower).  Eating chicken soup or other clear broths, and maintaining good nutrition.  Getting plenty of rest.  Using gargles or lozenges for comfort.  Controlling fevers with ibuprofen or acetaminophen as directed by your caregiver.  Increasing usage of your inhaler if you have asthma. Zinc gel and zinc lozenges, taken in the first 24 hours of the common cold, can shorten the duration and lessen the severity of symptoms. Pain medicines may help with fever, muscle aches, and throat pain. A variety of non-prescription medicines are available to treat congestion and runny nose. Your caregiver can make recommendations and may suggest nasal or lung inhalers for other symptoms.  HOME CARE INSTRUCTIONS   Only take over-the-counter or prescription medicines for pain, discomfort, or fever as directed by your caregiver.  Use a warm mist humidifier or inhale steam from a shower to increase air moisture. This may keep secretions moist and make it easier to breathe.  Drink enough water and fluids to keep your urine clear or pale yellow.  Rest as needed.  Return to work when your temperature has returned to normal or as your caregiver advises. You may need to  stay home longer to avoid infecting others. You can also use a face mask and careful hand washing to prevent spread of the virus. SEEK MEDICAL CARE IF:   After the first few days, you feel you are getting worse rather than better.  You need your  caregiver's advice about medicines to control symptoms.  You develop chills, worsening shortness of breath, or brown or red sputum. These may be signs of pneumonia.  You develop yellow or brown nasal discharge or pain in the face, especially when you bend forward. These may be signs of sinusitis.  You develop a fever, swollen neck glands, pain with swallowing, or white areas in the back of your throat. These may be signs of strep throat. SEEK IMMEDIATE MEDICAL CARE IF:   You have a fever.  You develop severe or persistent headache, ear pain, sinus pain, or chest pain.  You develop wheezing, a prolonged cough, cough up blood, or have a change in your usual mucus (if you have chronic lung disease).  You develop sore muscles or a stiff neck. Document Released: 09/16/2000 Document Revised: 06/15/2011 Document Reviewed: 07/25/2010 [facility] Patient Information 2014 [place], [place].  Viral Infections A viral infection can be caused by different types of viruses.Most viral infections are not serious and resolve on their own. However, some infections may cause severe symptoms and may lead to further complications. SYMPTOMS Viruses can frequently cause:  Minor sore throat.  Aches and pains.  Headaches.  Runny nose.  Different types of rashes.  Watery eyes.  Tiredness.  Cough.  Loss of appetite.  Gastrointestinal infections, resulting in nausea, vomiting, and diarrhea. These symptoms do not respond to antibiotics because the infection is not caused by bacteria. However, you might catch a bacterial infection following the viral infection. This is sometimes called a "superinfection." Symptoms of such a bacterial infection may include:  Worsening sore throat with pus and difficulty swallowing.  Swollen neck glands.  Chills and a high or persistent fever.  Severe headache.  Tenderness over the sinuses.  Persistent overall ill feeling (malaise), muscle aches, and tiredness  (fatigue).  Persistent cough.  Yellow, green, or brown mucus production with coughing. HOME CARE INSTRUCTIONS   Only take over-the-counter or prescription medicines for pain, discomfort, diarrhea, or fever as directed by your caregiver.  Drink enough water and fluids to keep your urine clear or pale yellow. Sports drinks can provide valuable electrolytes, sugars, and hydration.  Get plenty of rest and maintain proper nutrition. Soups and broths with crackers or rice are fine. SEEK IMMEDIATE MEDICAL CARE IF:   You have severe headaches, shortness of breath, chest pain, neck pain, or an unusual rash.  You have uncontrolled vomiting, diarrhea, or you are unable to keep down fluids.  You or your child has an oral temperature above 102 F (38.9 C), not controlled by medicine.  Your baby is older than 3 months with a rectal temperature of 102 F (38.9 C) or higher.  Your baby is 38 months old or younger with a rectal temperature of 100.4 F (38 C) or higher. MAKE SURE YOU:   Understand these instructions.  Will watch your condition.  Will get help right away if you are not doing well or get worse. Document Released: 12/31/2004 Document Revised: 06/15/2011 Document Reviewed: 07/28/2010 [facility] Patient Information 2014 [place], [place].

## 2013-05-24 NOTE — ED Notes (Signed)
Pt c/o cold sxs onset Monday Sxs include: diarrhea, chills, SOB, HA, BA, abd ain, cough Denies fevers, vomiting Taking OTC cold meds w/no relief Alert w/no signs of acute distress

## 2013-05-25 NOTE — ED Provider Notes (Signed)
Medical screening examination/treatment/procedure(s) were performed by non-physician practitioner and as supervising physician I was immediately available for consultation/collaboration.  Philipp Deputy, M.D.  Harden Mo, MD 05/25/13 [phone]

## 2014-03-12 ENCOUNTER — Encounter (HOSPITAL_COMMUNITY): Payer: Self-pay | Admitting: Emergency Medicine

## 2014-03-12 ENCOUNTER — Emergency Department (INDEPENDENT_AMBULATORY_CARE_PROVIDER_SITE_OTHER)
Admission: EM | Admit: 2014-03-12 | Discharge: 2014-03-12 | Disposition: A | Discharge status: Home / Self Care | Payer: Self-pay | Source: Home / Self Care | Attending: Emergency Medicine | Admitting: Emergency Medicine

## 2014-03-12 ENCOUNTER — Other Ambulatory Visit (HOSPITAL_COMMUNITY)
Admission: RE | Admit: 2014-03-12 | Discharge: 2014-03-12 | Disposition: A | Discharge status: Home / Self Care | Payer: Self-pay | Source: Ambulatory Visit | Attending: Emergency Medicine | Admitting: Emergency Medicine

## 2014-03-12 DIAGNOSIS — R1031 Right lower quadrant pain: Secondary | ICD-10-CM

## 2014-03-12 DIAGNOSIS — Z113 Encounter for screening for infections with a predominantly sexual mode of transmission: Secondary | ICD-10-CM | POA: Insufficient documentation

## 2014-03-12 DIAGNOSIS — N760 Acute vaginitis: Secondary | ICD-10-CM | POA: Insufficient documentation

## 2014-03-12 LAB — POCT I-STAT, CHEM 8
BUN: 5 mg/dL — ABNORMAL LOW (ref 6–23)
CALCIUM ION: 1.22 mmol/L (ref 1.12–1.23)
Chloride: 101 mEq/L (ref 96–112)
Creatinine, Ser: 0.8 mg/dL (ref 0.50–1.10)
GLUCOSE: 92 mg/dL (ref 70–99)
HEMATOCRIT: 48 % — AB (ref 36.0–46.0)
HEMOGLOBIN: 16.3 g/dL — AB (ref 12.0–15.0)
POTASSIUM: 4 meq/L (ref 3.7–5.3)
Sodium: 138 mEq/L (ref 137–147)
TCO2: 26 mmol/L (ref 0–100)

## 2014-03-12 LAB — POCT URINALYSIS DIP (DEVICE)
Bilirubin Urine: NEGATIVE
Glucose, UA: NEGATIVE mg/dL
KETONES UR: NEGATIVE mg/dL
LEUKOCYTES UA: NEGATIVE
NITRITE: NEGATIVE
PROTEIN: NEGATIVE mg/dL
Specific Gravity, Urine: 1.02 (ref 1.005–1.030)
UROBILINOGEN UA: 1 mg/dL (ref 0.0–1.0)
pH: 7 (ref 5.0–8.0)

## 2014-03-12 LAB — POCT PREGNANCY, URINE: Preg Test, Ur: NEGATIVE

## 2014-03-12 MED ORDER — CEPHALEXIN 500 MG PO CAPS: 500.0000 mg | ORAL_CAPSULE | Freq: Three times a day (TID) | ORAL | Status: DC

## 2014-03-12 MED ORDER — MELOXICAM 15 MG PO TABS: 15.0000 mg | ORAL_TABLET | Freq: Every day | ORAL | Status: DC

## 2014-03-12 NOTE — ED Notes (Signed)
Pt was discharged all documented on downtime paperwork due to that EPIC was not available at this time

## 2014-03-12 NOTE — Discharge Instructions (Signed)
Abdominal Pain, Women °Abdominal (stomach, pelvic, or belly) pain can be caused by many things. It is important to tell your doctor: °· The location of the pain. °· Does it come and go or is it present all the time? °· Are there things that start the pain (eating certain foods, exercise)? °· Are there other symptoms associated with the pain (fever, nausea, vomiting, diarrhea)? °All of this is helpful to know when trying to find the cause of the pain. °CAUSES  °· Stomach: virus or bacteria infection, or ulcer. °· Intestine: appendicitis (inflamed appendix), regional ileitis (Crohn's disease), ulcerative colitis (inflamed colon), irritable bowel syndrome, diverticulitis (inflamed diverticulum of the colon), or cancer of the stomach or intestine. °· Gallbladder disease or stones in the gallbladder. °· Kidney disease, kidney stones, or infection. °· Pancreas infection or cancer. °· Fibromyalgia (pain disorder). °· Diseases of the female organs: °¨ Uterus: fibroid (non-cancerous) tumors or infection. °¨ Fallopian tubes: infection or tubal pregnancy. °¨ Ovary: cysts or tumors. °¨ Pelvic adhesions (scar tissue). °¨ Endometriosis (uterus lining tissue growing in the pelvis and on the pelvic organs). °¨ Pelvic congestion syndrome (female organs filling up with blood just before the menstrual period). °¨ Pain with the menstrual period. °¨ Pain with ovulation (producing an egg). °¨ Pain with an IUD (intrauterine device, birth control) in the uterus. °¨ Cancer of the female organs. °· Functional pain (pain not caused by a disease, may improve without treatment). °· Psychological pain. °· Depression. °DIAGNOSIS  °Your doctor will decide the seriousness of your pain by doing an examination. °· Blood tests. °· X-rays. °· Ultrasound. °· CT scan (computed tomography, special type of X-ray). °· MRI (magnetic resonance imaging). °· Cultures, for infection. °· Barium enema (dye inserted in the large intestine, to better view it with  X-rays). °· Colonoscopy (looking in intestine with a lighted tube). °· Laparoscopy (minor surgery, looking in abdomen with a lighted tube). °· Major abdominal exploratory surgery (looking in abdomen with a large incision). °TREATMENT  °The treatment will depend on the cause of the pain.  °· Many cases can be observed and treated at home. °· Over-the-counter medicines recommended by your caregiver. °· Prescription medicine. °· Antibiotics, for infection. °· Birth control pills, for painful periods or for ovulation pain. °· Hormone treatment, for endometriosis. °· Nerve blocking injections. °· Physical therapy. °· Antidepressants. °· Counseling with a psychologist or psychiatrist. °· Minor or major surgery. °HOME CARE INSTRUCTIONS  °· Do not take laxatives, unless directed by your caregiver. °· Take over-the-counter pain medicine only if ordered by your caregiver. Do not take aspirin because it can cause an upset stomach or bleeding. °· Try a clear liquid diet (broth or water) as ordered by your caregiver. Slowly move to a bland diet, as tolerated, if the pain is related to the stomach or intestine. °· Have a thermometer and take your temperature several times a day, and record it. °· Bed rest and sleep, if it helps the pain. °· Avoid sexual intercourse, if it causes pain. °· Avoid stressful situations. °· Keep your follow-up appointments and tests, as your caregiver orders. °· If the pain does not go away with medicine or surgery, you may try: °¨ Acupuncture. °¨ Relaxation exercises (yoga, meditation). °¨ Group therapy. °¨ Counseling. °SEEK MEDICAL CARE IF:  °· You notice certain foods cause stomach pain. °· Your home care treatment is not helping your pain. °· You need stronger pain medicine. °· You want your IUD removed. °· You feel faint or   lightheaded. °· You develop nausea and vomiting. °· You develop a rash. °· You are having side effects or an allergy to your medicine. °SEEK IMMEDIATE MEDICAL CARE IF:  °· Your  pain does not go away or gets worse. °· You have a fever. °· Your pain is felt only in portions of the abdomen. The right side could possibly be appendicitis. The left lower portion of the abdomen could be colitis or diverticulitis. °· You are passing blood in your stools (bright red or black tarry stools, with or without vomiting). °· You have blood in your urine. °· You develop chills, with or without a fever. °· You pass out. °MAKE SURE YOU:  °· Understand these instructions. °· Will watch your condition. °· Will get help right away if you are not doing well or get worse. °Document Released: 01/18/2007 Document Revised: 08/07/2013 Document Reviewed: 02/07/2009 °Exit[employer]® Patient Information ©2015 [employer]. This information is not intended to replace advice given to you by your health care provider. Make sure you discuss any questions you have with your health care provider. ° °

## 2014-03-12 NOTE — ED Notes (Signed)
Pt states that she has had abdominal pain with urinary frequency along with nausea dizziness and headache. Pt is in no acute distress at this time

## 2014-03-12 NOTE — ED Provider Notes (Signed)
Chief Complaint   Abdominal Pain; Urinary Frequency; and Nausea   History of Present Illness   Elizabeth Ferguson is a 28 year old female who has had a three-week history of intermittent right lower quadrant abdominal pain. This comes and goes lasting for hours to days at a time. It is rated as 6/10 at the worst and now is a 5/10. The pain is worse with menses, with eating, and also with movement. The pain is localized to the right lower quadrant with radiation to the back. His Benicar made by mild vaginal discharge and 2 weeks ago she had an episode of diarrhea. There has been no fever, chills, anorexia, weight loss, dysuria, hematuria, diarrhea, constipation, or hematochezia. The patient has had a several year history of frequent headaches and these are worse the past few weeks. She's also felt intermittent dizziness and lightheadedness. She felt mildly nauseated off and on for the past 2 weeks but denies any vomiting. She still weak and rundown and tired and has had some urinary frequency for the past week and a half. The patient states she has never been sexually active.  Review of Systems   Other than as noted above, the patient denies any of the following symptoms: Constitutional:  No fever, chills, weight loss or anorexia. Abdomen:  No nausea, vomiting, hematememesis, melena, diarrhea, or hematochezia. GU:  No dysuria, frequency, urgency, or hematuria. Gyn:  No vaginal discharge, itching, abnormal bleeding, dyspareunia, or pelvic pain.  [place]   Past medical history, family history, social history, meds, and allergies were reviewed.   Physical Exam     Vital signs:  BP 127/82 mmHg  Pulse 108  Temp(Src) 98 F (36.7 C) (Oral)  Resp 16  SpO2 97%  LMP  Gen:  Alert, oriented, in no distress. Lungs:  Breath sounds clear and equal bilaterally.  No wheezes, rales or rhonchi. Heart:  Regular rhythm.  No gallops or murmers.   Abdomen:  Soft, flat, nondistended. No organomegaly or mass.  Bowel sounds are normally active. There is mild tenderness to palpation in the right lower quadrant without guarding or rebound. Pelvic:  Normal external genitalia. Vaginal and cervical mucosa were normal. There was no pain on cervical motion. Uterus was normal in size and shape and nontender. There was no tenderness or mass on the left. She had mild adnexal tenderness on the right but no mass.  DNA probes for gonorrhea, Chlamydia, Trichomonas, Gardnerella, and Candida were obtained. Skin:  Clear, warm and dry.  No rash.  Labs   Results for orders placed or performed during the hospital encounter of 03/12/14  Pregnancy, urine POC  Result Value Ref Range   Preg Test, Ur NEGATIVE NEGATIVE  I-STAT, chem 8  Result Value Ref Range   Sodium 138 137 - 147 mEq/L   Potassium 4.0 3.7 - 5.3 mEq/L   Chloride 101 96 - 112 mEq/L   BUN 5 (L) 6 - 23 mg/dL   Creatinine, Ser 0.80 0.50 - 1.10 mg/dL   Glucose, Bld 92 70 - 99 mg/dL   Calcium, Ion 1.22 1.12 - 1.23 mmol/L   TCO2 26 0 - 100 mmol/L   Hemoglobin 16.3 (H) 12.0 - 15.0 g/dL   HCT 48.0 (H) 36.0 - 46.0 %  POCT urinalysis dip (device)  Result Value Ref Range   Glucose, UA NEGATIVE NEGATIVE mg/dL   Bilirubin Urine NEGATIVE NEGATIVE   Ketones, ur NEGATIVE NEGATIVE mg/dL   Specific Gravity, Urine 1.020 1.005 - 1.030   Hgb urine dipstick TRACE (  A) NEGATIVE   pH 7.0 5.0 - 8.0   Protein, ur NEGATIVE NEGATIVE mg/dL   Urobilinogen, UA 1.0 0.0 - 1.0 mg/dL   Nitrite NEGATIVE NEGATIVE   Leukocytes, UA NEGATIVE NEGATIVE   Assessment   The encounter diagnosis was RLQ abdominal pain.  Differential diagnosis is ovarian cyst or urinary tract infection. Doubt appendicitis since it's been going on for 3 weeks and does not seem to be worsening. Likewise, ovarian torsion is unlikely. Nothing to suggest Crohn's disease or diverticulitis. Her urine is fairly clear, no evidence of kidney stone.  Plan     1.  Meds:  The following meds were prescribed:    Discharge Medication List as of 03/12/2014 11:06 AM      2.  Patient Education/Counseling:  The patient was given appropriate handouts, self care instructions, and instructed in symptomatic relief.  Follow-up within the next week or 2 at [facility] clinics.  3.  Follow up:  The patient was told to follow up here if no better in 3 to 4 days, or sooner if becoming worse in any way, and given some red flag symptoms such as worsening pain, fever, vomiting, or evidence of GI bleeding which would prompt immediate return.     Harden Mo, MD 03/12/14 [phone]

## 2014-03-12 NOTE — ED Notes (Signed)
Pt states that she has been having abdominal pain with cramping

## 2014-03-13 LAB — URINE CULTURE
Colony Count: 50000
Special Requests: NORMAL

## 2014-03-13 LAB — CERVICOVAGINAL ANCILLARY ONLY
CHLAMYDIA, DNA PROBE: NEGATIVE
Neisseria Gonorrhea: NEGATIVE
WET PREP (BD AFFIRM): NEGATIVE
Wet Prep (BD Affirm): NEGATIVE
Wet Prep (BD Affirm): NEGATIVE

## 2014-03-14 ENCOUNTER — Telehealth: Payer: Self-pay

## 2014-03-14 MED ORDER — MELOXICAM 15 MG PO TABS: 15.0000 mg | ORAL_TABLET | Freq: Every day | ORAL | Status: DC

## 2014-03-14 MED ORDER — CEPHALEXIN 500 MG PO CAPS: 500.0000 mg | ORAL_CAPSULE | Freq: Three times a day (TID) | ORAL | Status: DC

## 2014-03-14 NOTE — Telephone Encounter (Signed)
Per urgent care called patient to set up appointment for ovarian cyst. Wrong telephone number in the system.

## 2014-03-14 NOTE — ED Notes (Signed)
Pt  Called   Stating  Unable  To get  An  appt   At  [facility]    Attempted  To  Call pt  Back  Got  answeing  Machine    Message  Left  For  Pt  tocall  Back    Dr  Jake Michaelis  Aware  As  welland  He  Tried  To call pt  Back    Dr  Jake Michaelis  Stated  He  Will try to get  Pt   An   appt     At  [facility]  After   Consulting   With  Pt if she answers

## 2014-03-15 MED ORDER — MELOXICAM 15 MG PO TABS: 15.0000 mg | ORAL_TABLET | Freq: Every day | ORAL | Status: DC

## 2014-03-15 MED ORDER — CEPHALEXIN 500 MG PO CAPS: 500.0000 mg | ORAL_CAPSULE | Freq: Three times a day (TID) | ORAL | Status: DC

## 2014-06-15 ENCOUNTER — Encounter: Payer: Self-pay | Admitting: Obstetrics & Gynecology

## 2014-10-29 ENCOUNTER — Other Ambulatory Visit: Payer: Self-pay | Admitting: Obstetrics and Gynecology

## 2014-11-12 ENCOUNTER — Encounter (HOSPITAL_BASED_OUTPATIENT_CLINIC_OR_DEPARTMENT_OTHER): Payer: Self-pay | Admitting: *Deleted

## 2014-11-12 NOTE — H&P (Signed)
  Admission History and Physical Exam for a Gynecology Patient  Elizabeth Ferguson is a 29 y.o. female, No obstetric history on file., who presents for laparoscopy. She has been followed at the [facility] and Gynecology division of [facility]. She complains of pelvic pain. She was told that she may have endometriosis. Birth control pills have not relieved her discomfort. An ultrasound was performed that was thought to be benign. Gonorrhea and Chlamydia cultures were negative.  OB History    No data available      Past Medical History  Diagnosis Date  . Acne   . Eczema   . Pelvic pain in female   . Irregular menstrual cycle   . Hyperlipidemia     No prescriptions prior to admission    Past Surgical History  Procedure Laterality Date  . No past surgeries      No Known Allergies  Family History: family history includes Diabetes in her brother; Heart failure in her brother; Stroke in her brother.  Social History:  reports that she has never smoked. She has never used smokeless tobacco. She reports that she does not drink alcohol or use illicit drugs.  Review of systems: See HPI.  Admission Physical Exam:    There is no weight on file to calculate BMI.  Height 5' 6.5" (1.689 m), last menstrual period 09/27/2014.  HEENT:                 Within normal limits Chest:                   Clear Heart:                    Regular rate and rhythm Breasts:                No masses, skin changes, bleeding, or discharge present Abdomen:             Nontender, no masses Extremities:          Grossly normal Neurologic exam: Grossly normal  Pelvic exam:  External genitalia: normal general appearance Vaginal: normal without tenderness, induration or masses Cervix: normal appearance Adnexa: normal bimanual exam Uterus: Normal size shape and consistency  Assessment:  Pelvic pain  Plan:  The patient will undergo a diagnostic laparoscopy. She  understands the indications for her surgical procedure. She also understands the alternative treatment options. She accepts the risk of, but not limited to, anesthetic complications, bleeding, infections, and possible damage to the surrounding organs. She understands that no guarantees can be given concerning the total relief of her discomfort.   Eli Hose 11/12/2014

## 2014-11-12 NOTE — Progress Notes (Signed)
NPO AFTER MN.  NEEDS CBC AND URINE PREG.  ARRIVE AT 0700.

## 2014-11-13 ENCOUNTER — Encounter (HOSPITAL_BASED_OUTPATIENT_CLINIC_OR_DEPARTMENT_OTHER)
Admission: RE | Disposition: A | Discharge status: Home / Self Care | Payer: Self-pay | Source: Ambulatory Visit | Attending: Obstetrics and Gynecology

## 2014-11-13 ENCOUNTER — Ambulatory Visit (HOSPITAL_BASED_OUTPATIENT_CLINIC_OR_DEPARTMENT_OTHER)
Admission: RE | Admit: 2014-11-13 | Discharge: 2014-11-13 | Disposition: A | Discharge status: Home / Self Care | Payer: Self-pay | Source: Ambulatory Visit | Attending: Obstetrics and Gynecology | Admitting: Obstetrics and Gynecology

## 2014-11-13 ENCOUNTER — Encounter (HOSPITAL_BASED_OUTPATIENT_CLINIC_OR_DEPARTMENT_OTHER): Payer: Self-pay | Admitting: *Deleted

## 2014-11-13 ENCOUNTER — Ambulatory Visit (HOSPITAL_BASED_OUTPATIENT_CLINIC_OR_DEPARTMENT_OTHER): Payer: Self-pay | Admitting: Anesthesiology

## 2014-11-13 ENCOUNTER — Ambulatory Visit (HOSPITAL_BASED_OUTPATIENT_CLINIC_OR_DEPARTMENT_OTHER): Payer: PRIVATE HEALTH INSURANCE | Admitting: Anesthesiology

## 2014-11-13 DIAGNOSIS — Z6831 Body mass index (BMI) 31.0-31.9, adult: Secondary | ICD-10-CM | POA: Insufficient documentation

## 2014-11-13 DIAGNOSIS — N946 Dysmenorrhea, unspecified: Secondary | ICD-10-CM | POA: Insufficient documentation

## 2014-11-13 DIAGNOSIS — D252 Subserosal leiomyoma of uterus: Secondary | ICD-10-CM | POA: Insufficient documentation

## 2014-11-13 DIAGNOSIS — N803 Endometriosis of pelvic peritoneum: Secondary | ICD-10-CM | POA: Insufficient documentation

## 2014-11-13 DIAGNOSIS — E785 Hyperlipidemia, unspecified: Secondary | ICD-10-CM | POA: Insufficient documentation

## 2014-11-13 DIAGNOSIS — N736 Female pelvic peritoneal adhesions (postinfective): Secondary | ICD-10-CM | POA: Insufficient documentation

## 2014-11-13 HISTORY — DX: Hyperlipidemia, unspecified: E78.5

## 2014-11-13 HISTORY — PX: LAPAROSCOPY: SHX197

## 2014-11-13 HISTORY — DX: Irregular menstruation, unspecified: N92.6

## 2014-11-13 HISTORY — PX: LYSIS OF ADHESION: SHX5961

## 2014-11-13 HISTORY — DX: Pelvic and perineal pain: R10.2

## 2014-11-13 LAB — CBC
HCT: 39 % (ref 36.0–46.0)
Hemoglobin: 13.1 g/dL (ref 12.0–15.0)
MCH: 30.4 pg (ref 26.0–34.0)
MCHC: 33.6 g/dL (ref 30.0–36.0)
MCV: 90.5 fL (ref 78.0–100.0)
Platelets: 300 10*3/uL (ref 150–400)
RBC: 4.31 MIL/uL (ref 3.87–5.11)
RDW: 12.7 % (ref 11.5–15.5)
WBC: 5.2 10*3/uL (ref 4.0–10.5)

## 2014-11-13 LAB — POCT PREGNANCY, URINE: Preg Test, Ur: NEGATIVE

## 2014-11-13 SURGERY — LAPAROSCOPY, DIAGNOSTIC
Anesthesia: General | Site: Pelvis | Laterality: Right

## 2014-11-13 MED ORDER — MIDAZOLAM HCL 5 MG/5ML IJ SOLN
INTRAMUSCULAR | Status: DC | PRN
  Administered 2014-11-13: 2 mg via INTRAVENOUS

## 2014-11-13 MED ORDER — LACTATED RINGERS IV SOLN
INTRAVENOUS | Status: DC
  Administered 2014-11-13 (×2): via INTRAVENOUS
  Filled 2014-11-13: qty 1000

## 2014-11-13 MED ORDER — KETOROLAC TROMETHAMINE 30 MG/ML IJ SOLN
30.0000 mg | Freq: Once | INTRAMUSCULAR | Status: AC
  Administered 2014-11-13: 30 mg via INTRAVENOUS
  Filled 2014-11-13: qty 1

## 2014-11-13 MED ORDER — OXYCODONE HCL 5 MG PO TABS
5.0000 mg | ORAL_TABLET | Freq: Once | ORAL | Status: AC | PRN
  Administered 2014-11-13: 5 mg via ORAL
  Filled 2014-11-13: qty 1

## 2014-11-13 MED ORDER — FENTANYL CITRATE (PF) 100 MCG/2ML IJ SOLN
INTRAMUSCULAR | Status: DC | PRN
  Administered 2014-11-13 (×4): 50 ug via INTRAVENOUS

## 2014-11-13 MED ORDER — GLYCOPYRROLATE 0.2 MG/ML IJ SOLN
INTRAMUSCULAR | Status: DC | PRN
  Administered 2014-11-13: 0.4 mg via INTRAVENOUS

## 2014-11-13 MED ORDER — OXYCODONE-ACETAMINOPHEN 5-325 MG PO TABS: 1.0000 | ORAL_TABLET | ORAL | Status: DC | PRN

## 2014-11-13 MED ORDER — OXYCODONE HCL 5 MG PO TABS
ORAL_TABLET | ORAL | Status: AC
  Filled 2014-11-13: qty 1

## 2014-11-13 MED ORDER — SUCCINYLCHOLINE CHLORIDE 20 MG/ML IJ SOLN
INTRAMUSCULAR | Status: DC | PRN
  Administered 2014-11-13: 100 mg via INTRAVENOUS

## 2014-11-13 MED ORDER — ACETAMINOPHEN 10 MG/ML IV SOLN
INTRAVENOUS | Status: DC | PRN
  Administered 2014-11-13: 1000 mg via INTRAVENOUS

## 2014-11-13 MED ORDER — IBUPROFEN 800 MG PO TABS: 800.0000 mg | ORAL_TABLET | Freq: Three times a day (TID) | ORAL | Status: DC | PRN

## 2014-11-13 MED ORDER — ONDANSETRON HCL 4 MG/2ML IJ SOLN
INTRAMUSCULAR | Status: DC | PRN
  Administered 2014-11-13: 4 mg via INTRAVENOUS

## 2014-11-13 MED ORDER — LIDOCAINE HCL (CARDIAC) 20 MG/ML IV SOLN
INTRAVENOUS | Status: DC | PRN
  Administered 2014-11-13: 100 mg via INTRAVENOUS

## 2014-11-13 MED ORDER — PROPOFOL 10 MG/ML IV BOLUS
INTRAVENOUS | Status: DC | PRN
  Administered 2014-11-13: 200 mg via INTRAVENOUS

## 2014-11-13 MED ORDER — PROMETHAZINE HCL 25 MG/ML IJ SOLN
6.2500 mg | INTRAMUSCULAR | Status: DC | PRN
Start: 2014-11-13 — End: 2014-11-13
  Filled 2014-11-13: qty 1

## 2014-11-13 MED ORDER — ROCURONIUM BROMIDE 100 MG/10ML IV SOLN
INTRAVENOUS | Status: DC | PRN
  Administered 2014-11-13: 5 mg via INTRAVENOUS
  Administered 2014-11-13: 15 mg via INTRAVENOUS

## 2014-11-13 MED ORDER — MIDAZOLAM HCL 2 MG/2ML IJ SOLN
INTRAMUSCULAR | Status: AC
  Filled 2014-11-13: qty 2

## 2014-11-13 MED ORDER — FENTANYL CITRATE (PF) 100 MCG/2ML IJ SOLN
INTRAMUSCULAR | Status: AC
  Filled 2014-11-13: qty 6

## 2014-11-13 MED ORDER — HYDROMORPHONE HCL 1 MG/ML IJ SOLN
0.2500 mg | INTRAMUSCULAR | Status: DC | PRN
  Filled 2014-11-13: qty 1

## 2014-11-13 MED ORDER — NEOSTIGMINE METHYLSULFATE 10 MG/10ML IV SOLN
INTRAVENOUS | Status: DC | PRN
  Administered 2014-11-13: 3 mg via INTRAVENOUS

## 2014-11-13 MED ORDER — OXYCODONE HCL 5 MG/5ML PO SOLN
5.0000 mg | Freq: Once | ORAL | Status: AC | PRN
  Filled 2014-11-13: qty 5

## 2014-11-13 MED ORDER — PROMETHAZINE HCL 12.5 MG PO TABS: 12.5000 mg | ORAL_TABLET | Freq: Four times a day (QID) | ORAL | Status: DC | PRN

## 2014-11-13 MED ORDER — DEXAMETHASONE SODIUM PHOSPHATE 4 MG/ML IJ SOLN
INTRAMUSCULAR | Status: DC | PRN
  Administered 2014-11-13: 10 mg via INTRAVENOUS

## 2014-11-13 MED ORDER — BUPIVACAINE-EPINEPHRINE 0.5% -1:200000 IJ SOLN
INTRAMUSCULAR | Status: DC | PRN
  Administered 2014-11-13: 7 mL

## 2014-11-13 SURGICAL SUPPLY — 51 items
BAG URINE DRAINAGE (UROLOGICAL SUPPLIES) ×4 IMPLANT
BLADE SURG 11 STRL SS (BLADE) ×4 IMPLANT
CATH FOLEY 2WAY SLVR  5CC 14FR (CATHETERS) ×1
CATH FOLEY 2WAY SLVR 5CC 14FR (CATHETERS) ×3 IMPLANT
CATH ROBINSON RED A/P 16FR (CATHETERS) IMPLANT
CHLORAPREP W/TINT 26ML (MISCELLANEOUS) ×4 IMPLANT
COVER MAYO STAND STRL (DRAPES) ×4 IMPLANT
DRAPE UNDERBUTTOCKS STRL (DRAPE) ×4 IMPLANT
DRSG COVADERM PLUS 2X2 (GAUZE/BANDAGES/DRESSINGS) IMPLANT
DRSG OPSITE POSTOP 3X4 (GAUZE/BANDAGES/DRESSINGS) IMPLANT
DRSG TEGADERM 2-3/8X2-3/4 SM (GAUZE/BANDAGES/DRESSINGS) ×4 IMPLANT
FORCEPS CUTTING 33CM 5MM (CUTTING FORCEPS) IMPLANT
FORCEPS CUTTING 45CM 5MM (CUTTING FORCEPS) IMPLANT
GLOVE BIOGEL PI IND STRL 7.5 (GLOVE) ×3 IMPLANT
GLOVE BIOGEL PI IND STRL 8.5 (GLOVE) ×3 IMPLANT
GLOVE BIOGEL PI INDICATOR 7.5 (GLOVE) ×1
GLOVE BIOGEL PI INDICATOR 8.5 (GLOVE) ×1
GLOVE ECLIPSE 8.0 STRL XLNG CF (GLOVE) ×8 IMPLANT
GLOVE SURG SS PI 7.5 STRL IVOR (GLOVE) ×4 IMPLANT
GOWN STRL REUS W/TWL 2XL LVL3 (GOWN DISPOSABLE) ×4 IMPLANT
GOWN STRL REUS W/TWL LRG LVL3 (GOWN DISPOSABLE) ×12 IMPLANT
HOLDER FOLEY CATH W/STRAP (MISCELLANEOUS) ×4 IMPLANT
MANIFOLD NEPTUNE II (INSTRUMENTS) IMPLANT
NEEDLE HYPO 25X1 1.5 SAFETY (NEEDLE) ×4 IMPLANT
NEEDLE INSUFFLATION 14GA 120MM (NEEDLE) ×4 IMPLANT
NS IRRIG 500ML POUR BTL (IV SOLUTION) ×4 IMPLANT
PACK BASIN DAY SURGERY FS (CUSTOM PROCEDURE TRAY) ×4 IMPLANT
PACK LAPAROSCOPY II (CUSTOM PROCEDURE TRAY) ×4 IMPLANT
PAD OB MATERNITY 4.3X12.25 (PERSONAL CARE ITEMS) ×4 IMPLANT
PAD PREP 24X48 CUFFED NSTRL (MISCELLANEOUS) ×4 IMPLANT
PADDING ION DISPOSABLE (MISCELLANEOUS) ×4 IMPLANT
POUCH SPECIMEN RETRIEVAL 10MM (ENDOMECHANICALS) IMPLANT
SET IRRIG TUBING LAPAROSCOPIC (IRRIGATION / IRRIGATOR) IMPLANT
SHEARS HARMONIC ACE PLUS 36CM (ENDOMECHANICALS) IMPLANT
SOLUTION ANTI FOG 6CC (MISCELLANEOUS) ×4 IMPLANT
SPONGE GAUZE 2X2 8PLY STRL LF (GAUZE/BANDAGES/DRESSINGS) ×4 IMPLANT
STRIP CLOSURE SKIN 1/4X3 (GAUZE/BANDAGES/DRESSINGS) IMPLANT
STRIP CLOSURE SKIN 1/4X4 (GAUZE/BANDAGES/DRESSINGS) ×4 IMPLANT
SUT MNCRL AB 3-0 PS2 27 (SUTURE) ×4 IMPLANT
SUT VIC AB 2-0 UR6 27 (SUTURE) ×8 IMPLANT
SUT VICRYL 0 ENDOLOOP (SUTURE) IMPLANT
SYR 50ML LL SCALE MARK (SYRINGE) IMPLANT
SYR CONTROL 10ML LL (SYRINGE) ×4 IMPLANT
SYRINGE 10CC LL (SYRINGE) ×4 IMPLANT
TOWEL OR 17X24 6PK STRL BLUE (TOWEL DISPOSABLE) ×8 IMPLANT
TRAY DSU PREP LF (CUSTOM PROCEDURE TRAY) ×4 IMPLANT
TROCAR BALLN 12MMX100 BLUNT (TROCAR) ×4 IMPLANT
TROCAR BLADELESS OPT 5 100 (ENDOMECHANICALS) ×4 IMPLANT
TUBING INSUFFLATION W/FILTER (TUBING) ×4 IMPLANT
WARMER LAPAROSCOPE (MISCELLANEOUS) ×4 IMPLANT
WATER STERILE IRR 500ML POUR (IV SOLUTION) ×4 IMPLANT

## 2014-11-13 NOTE — H&P (Signed)
The patient was interviewed and examined today.  The previously documented history and physical examination was reviewed. There are no changes. The operative procedure was reviewed. The risks and benefits were outlined again. The specific risks include, but are not limited to, anesthetic complications, bleeding, infections, and possible damage to the surrounding organs. The patient's questions were answered.  We are ready to proceed as outlined. The likelihood of the patient achieving the goals of this procedure is very likely.  BP 153/88 mmHg  Pulse 87  Temp(Src) 98.6 F (37 C) (Oral)  Resp 16  Ht 5' 6.5" (1.689 m)  Wt 197 lb (89.359 kg)  BMI 31.32 kg/m2  SpO2 100%  LMP 09/27/2014 (Approximate)  CBC    Component Value Date/Time   WBC 5.2 11/13/2014 0730   RBC 4.31 11/13/2014 0730   HGB 13.1 11/13/2014 0730   HCT 39.0 11/13/2014 0730   PLT 300 11/13/2014 0730   MCV 90.5 11/13/2014 0730   MCH 30.4 11/13/2014 0730   MCHC 33.6 11/13/2014 0730   RDW 12.7 11/13/2014 0730     Gildardo Cranker, M.D.

## 2014-11-13 NOTE — Anesthesia Postprocedure Evaluation (Signed)
  Anesthesia Post-op Note  Patient: Elizabeth Ferguson  Procedure(s) Performed: Procedure(s): LAPAROSCOPY DIAGNOSTIC WITH PELVIC BIOPSY (N/A) LYSIS OF ADHESION (N/A)  Patient Location: PACU  Anesthesia Type:General  Level of Consciousness: awake and alert   Airway and Oxygen Therapy: Patient Spontanous Breathing  Post-op Pain: none  Post-op Assessment: Post-op Vital signs reviewed              Post-op Vital Signs: Reviewed  Last Vitals:  Filed Vitals:   11/13/14 1210  BP: 128/81  Pulse: 70  Temp: 36.6 C  Resp: 14    Complications: No apparent anesthesia complications

## 2014-11-13 NOTE — Transfer of Care (Signed)
Immediate Anesthesia Transfer of Care Note  Patient: Elizabeth Ferguson  Procedure(s) Performed: Procedure(s) (LRB): LAPAROSCOPY DIAGNOSTIC WITH PELVIC BIOPSY (N/A) LYSIS OF ADHESION (N/A)  Patient Location: PACU  Anesthesia Type: General  Level of Consciousness: awake, oriented, sedated and patient cooperative  Airway & Oxygen Therapy: Patient Spontanous Breathing and Patient connected to face mask oxygen  Post-op Assessment: Report given to PACU RN and Post -op Vital signs reviewed and stable  Post vital signs: Reviewed and stable  Complications: No apparent anesthesia complications

## 2014-11-13 NOTE — Anesthesia Preprocedure Evaluation (Addendum)
Anesthesia Evaluation  Patient identified by MRN, date of birth, ID band Patient awake    Reviewed: Allergy & Precautions, NPO status , Patient's Chart, lab work & pertinent test results  Airway Mallampati: II  TM Distance: >3 FB Neck ROM: Full    Dental  (+) Teeth Intact, Dental Advisory Given   Pulmonary neg pulmonary ROS,  breath sounds clear to auscultation        Cardiovascular negative cardio ROS  Rhythm:Regular Rate:Normal     Neuro/Psych negative neurological ROS     GI/Hepatic negative GI ROS, Neg liver ROS,   Endo/Other  Morbid obesity  Renal/GU negative Renal ROS     Musculoskeletal negative musculoskeletal ROS (+)   Abdominal   Peds  Hematology negative hematology ROS (+)   Anesthesia Other Findings   Reproductive/Obstetrics                           Anesthesia Physical Anesthesia Plan  ASA: II  Anesthesia Plan: General   Post-op Pain Management:    Induction: Intravenous  Airway Management Planned: Oral ETT  Additional Equipment:   Intra-op Plan:   Post-operative Plan: Extubation in OR  Informed Consent: I have reviewed the patients History and Physical, chart, labs and discussed the procedure including the risks, benefits and alternatives for the proposed anesthesia with the patient or authorized representative who has indicated his/her understanding and acceptance.   Dental advisory given  Plan Discussed with: CRNA  Anesthesia Plan Comments:         Anesthesia Quick Evaluation

## 2014-11-13 NOTE — Anesthesia Procedure Notes (Signed)
Procedure Name: Intubation Date/Time: 11/13/2014 8:28 AM Performed by: Denna Haggard D Pre-anesthesia Checklist: Patient identified, Emergency Drugs available, Suction available and Patient being monitored Patient Re-evaluated:Patient Re-evaluated prior to inductionOxygen Delivery Method: Circle System Utilized Preoxygenation: Pre-oxygenation with 100% oxygen Intubation Type: IV induction Ventilation: Mask ventilation without difficulty Laryngoscope Size: Mac and 3 Grade View: Grade I Tube type: Oral Tube size: 7.0 mm Number of attempts: 1 Airway Equipment and Method: Stylet,  Oral airway and LTA kit utilized Placement Confirmation: ETT inserted through vocal cords under direct vision,  positive ETCO2 and breath sounds checked- equal and bilateral Secured at: 22 cm Tube secured with: Tape Dental Injury: Teeth and Oropharynx as per pre-operative assessment

## 2014-11-13 NOTE — Discharge Instructions (Signed)

## 2014-11-13 NOTE — Op Note (Signed)
OPERATIVE NOTE  Elizabeth Ferguson  DOB:    [birth year]  MRN:    [id number]  CSN:    [id number]  Date of Surgery:  11/13/2014  Preoperative Diagnosis:  Dysmenorrhea  Postoperative Diagnosis:  Dysmenorrhea  Pelvic adhesions  Pelvic lesions consistent with endometriosis  Fibroid uterus  Procedure:  Diagnostic laparoscopy  Laparoscopic pelvic biopsies  Laparoscopic lysis of adhesions  Surgeon:  Gildardo Cranker, M.D.  Assistant:  None  Anesthetic:  General  Disposition:  The patient is a 29 year old who presents with dysmenorrhea that has not improved with pain medication and with hormonal therapy. She wishes to proceed with laparoscopy. She understands the indications for surgical procedure. She accepts the risk of, but not limited to, anesthesia complications, bleeding, infections, and possible damage to the surrounding organs. She understands that no guarantees can be given concerning the total relief of her discomfort.  Findings:  The uterus was normal size. There was a 0.5 cm fibroid on the right fundus located subserosally. The fallopian tubes are normal bilaterally. The ovaries were normal bilaterally. The anterior cul-de-sac was normal. The posterior cul-de-sac was normal. There were "powder burn" lesions on the left pelvic sidewall and on the left uterosacral ligament. The appendix appeared normal. The upper abdomen appeared normal. There were moderate adhesions between the right pelvic sidewall and the mesentery of the bowel. Pictures were taken.  Procedure:  The patient was taken to the operating room where a general and a set was given. The patient's abdomen was prepped with chloral prep. The vagina and the perineum were prepped with Betadine. A Foley catheter was placed in the bladder. Examination under anesthesia was performed. An acorn cannula was placed inside the cervix. The patient was sterilely draped. The subumbilical area was injected with half  percent Marcaine with epinephrine. An incision was made in the varies needle was inserted. Proper placement was confirmed using the saline drop test. A pneumoperitoneum was then obtained. The laparoscopic trocar was substituted for the varies needle. The laparoscope was inserted. The pelvis was visualized with findings as mentioned above. The lower abdomen in the midline Watson injected with half percent Marcaine with epinephrine. An incision was made and a 5 mm port was placed under direct visualization. Care was taken not to damage any of the vital structures as our trochars were placed. The pelvis was carefully inspected for endometriosis. Pictures were taken. 2 biopsies were obtained. One biopsy was from the left pelvic sidewall. A second biopsy was obtained from the left uterosacral ligament. The adhesion between the right pelvic sidewall and the mesentery of the bowel was then lysed using a combination of blunt and sharp dissection. Care was taken not to damage the bowel or any of the vital structures. The pelvis was inspected for hemostasis and hemostasis was adequate. The procedure was then ended. The 5 mm trocar was removed under direct visualization. The pneumoperitoneum was allowed to escape. The subumbilical trocar was removed. The fascia was closed using interrupted sutures of 2-0 Vicryl. Both skin incisions were closed using 3-0 Monocryl. Sponge, needle, and isthmic counts were correct on 2 occasions. The estimated blood loss was 10 cc. The patient tolerated her procedure well. She was awakened from her anesthesia without difficulty. She was transferred to the recovery room in stable condition. The pelvic biopsies were sent to pathology.  Followup instructions:  The patient will return to see Dr. Raphael Gibney in 2 weeks. She was given a copy of the postoperative instructions for patients who've undergone laparoscopy.  Discharge medications:  Motrin 800 mg every 8 hours as needed for mild to  moderate pain. Percocet one or 2 tablets every 4 hours as needed for severe pain. Phenergan 12.5 mg every 6 hours as needed for nausea.  Gildardo Cranker, M.D.  11/13/2014

## 2014-11-14 ENCOUNTER — Encounter (HOSPITAL_BASED_OUTPATIENT_CLINIC_OR_DEPARTMENT_OTHER): Payer: Self-pay | Admitting: Obstetrics and Gynecology

## 2015-01-14 ENCOUNTER — Emergency Department (INDEPENDENT_AMBULATORY_CARE_PROVIDER_SITE_OTHER)
Admission: EM | Admit: 2015-01-14 | Discharge: 2015-01-14 | Disposition: A | Discharge status: Home / Self Care | Payer: PRIVATE HEALTH INSURANCE | Source: Home / Self Care

## 2015-01-14 ENCOUNTER — Encounter (HOSPITAL_COMMUNITY): Payer: Self-pay | Admitting: Emergency Medicine

## 2015-01-14 DIAGNOSIS — J0100 Acute maxillary sinusitis, unspecified: Secondary | ICD-10-CM | POA: Diagnosis not present

## 2015-01-14 MED ORDER — AMOXICILLIN-POT CLAVULANATE 875-125 MG PO TABS: 1.0000 | ORAL_TABLET | Freq: Two times a day (BID) | ORAL | Status: DC

## 2015-01-14 NOTE — ED Provider Notes (Signed)
CSN: [id number]     Arrival date & time 01/14/15  1311 History   None    Chief Complaint  Patient presents with  . URI   (Consider location/radiation/quality/duration/timing/severity/associated sxs/prior Treatment) Patient is a 29 y.o. female presenting with URI. The history is provided by the patient. No language interpreter was used.  URI Presenting symptoms: congestion and facial pain   Presenting symptoms: no cough and no fever   Severity:  Moderate Onset quality:  Gradual Timing:  Constant Progression:  Unchanged Chronicity:  New Relieved by:  Nothing Worsened by:  Certain positions Ineffective treatments:  OTC medications Associated symptoms: headaches, myalgias, neck pain, sinus pain and sneezing   Associated symptoms: no swollen glands and no wheezing   Risk factors: sick contacts     Past Medical History  Diagnosis Date  . Acne   . Eczema   . Pelvic pain in female   . Irregular menstrual cycle   . Hyperlipidemia    Past Surgical History  Procedure Laterality Date  . No past surgeries    . Laparoscopy N/A 11/13/2014    Procedure: LAPAROSCOPY DIAGNOSTIC WITH PELVIC BIOPSY;  Surgeon: Ena Dawley, MD;  Location: [facility];  Service: Gynecology;  Laterality: N/A;  . Lysis of adhesion N/A 11/13/2014    Procedure: LYSIS OF ADHESION;  Surgeon: Ena Dawley, MD;  Location: [facility];  Service: Gynecology;  Laterality: N/A;   Family History  Problem Relation Age of Onset  . Diabetes Brother   . Stroke Brother   . Heart failure Brother    Social History  Substance Use Topics  . Smoking status: Never Smoker   . Smokeless tobacco: Never Used  . Alcohol Use: No   OB History    No data available     Review of Systems  Constitutional: Positive for chills. Negative for fever.  HENT: Positive for congestion, sinus pressure and sneezing.   Eyes: Negative.   Respiratory: Negative for cough and wheezing.   Cardiovascular:  Negative.   Gastrointestinal: Negative for nausea, vomiting and abdominal pain.  Endocrine: Negative.   Genitourinary: Negative.   Musculoskeletal: Positive for myalgias and neck pain.  Skin: Negative for rash.  Allergic/Immunologic: Negative.   Neurological: Positive for headaches.  Hematological: Negative.   Psychiatric/Behavioral: Negative.   All other systems reviewed and are negative.   Allergies  Review of patient's allergies indicates no known allergies.  Home Medications   Prior to Admission medications   Medication Sig Start Date End Date Taking? Authorizing Provider  medroxyPROGESTERone (PROVERA) 10 MG tablet Take 10 mg by mouth daily.   Yes Historical Provider, MD  amoxicillin-clavulanate (AUGMENTIN) 875-125 MG tablet Take 1 tablet by mouth every 12 (twelve) hours. 40/98/11   Tori Milks, NP  ibuprofen (ADVIL,MOTRIN) 800 MG tablet Take 1 tablet (800 mg total) by mouth every 8 (eight) hours as needed. 11/13/14   Ena Dawley, MD  oxyCODONE-acetaminophen (ROXICET) 5-325 MG per tablet Take 1 tablet by mouth every 4 (four) hours as needed for severe pain. 11/13/14   Ena Dawley, MD  promethazine (PHENERGAN) 12.5 MG tablet Take 1 tablet (12.5 mg total) by mouth every 6 (six) hours as needed for nausea or vomiting. 11/13/14   Ena Dawley, MD   Meds Ordered and Administered this Visit  Medications - No data to display  BP 130/70 mmHg  Pulse 80  Temp(Src) 98.3 F (36.8 C) (Oral)  Resp 16  SpO2 100%  LMP 12/21/2014 (Exact Date) No data found.  Physical Exam  Constitutional: She is oriented to person, place, and time. Vital signs are normal. She appears well-developed and well-nourished. She is active and cooperative.  Non-toxic appearance. No distress.  HENT:  Head: Normocephalic.  Right Ear: Tympanic membrane normal.  Left Ear: Tympanic membrane normal.  Nose: Mucosal edema present. Right sinus exhibits maxillary sinus tenderness and frontal sinus  tenderness. Left sinus exhibits maxillary sinus tenderness and frontal sinus tenderness.  Mouth/Throat: Uvula is midline, oropharynx is clear and moist and mucous membranes are normal.  Eyes: Conjunctivae, EOM and lids are normal. Pupils are equal, round, and reactive to light.  Neck: Trachea normal, normal range of motion and full passive range of motion without pain. Neck supple. No tracheal deviation present.  Cardiovascular: Normal rate, regular rhythm, normal heart sounds and normal pulses.   No murmur heard. Pulmonary/Chest: Effort normal and breath sounds normal. She has no wheezes.  Abdominal: Soft. Bowel sounds are normal. There is no tenderness.  Musculoskeletal: Normal range of motion.  Lymphadenopathy:    She has no cervical adenopathy.  Neurological: She is alert and oriented to person, place, and time. No cranial nerve deficit or sensory deficit. GCS eye subscore is 4. GCS verbal subscore is 5. GCS motor subscore is 6.  Skin: Skin is warm and dry. No rash noted.  Psychiatric: She has a normal mood and affect. Her speech is normal and behavior is normal.  Nursing note and vitals reviewed.   ED Course  Procedures (including critical care time)  Labs Review Labs Reviewed - No data to display  Imaging Review No results found.        MDM   1. Acute maxillary sinusitis, recurrence not specified     1440: Discussed plan of care with pt: May take OTC meds as label directed (tylenol/ibuprofen, chloraseptic). Rest,push fluids, take antibiotic as directed. F/u with your PCP for recheck. Return as needed. Pt verbalized understanding to this provider.    Tori Milks, NP 51/02/58 [id number]

## 2015-01-14 NOTE — Discharge Instructions (Signed)
Sinusitis, Adult °Sinusitis is redness, soreness, and inflammation of the paranasal sinuses. Paranasal sinuses are air pockets within the bones of your face. They are located beneath your eyes, in the middle of your forehead, and above your eyes. In healthy paranasal sinuses, mucus is able to drain out, and air is able to circulate through them by way of your nose. However, when your paranasal sinuses are inflamed, mucus and air can become trapped. This can allow bacteria and other germs to grow and cause infection. °Sinusitis can develop quickly and last only a short time (acute) or continue over a long period (chronic). Sinusitis that lasts for more than 12 weeks is considered chronic. °CAUSES °Causes of sinusitis include: °· Allergies. °· Structural abnormalities, such as displacement of the cartilage that separates your nostrils (deviated septum), which can decrease the air flow through your nose and sinuses and affect sinus drainage. °· Functional abnormalities, such as when the small hairs (cilia) that line your sinuses and help remove mucus do not work properly or are not present. °SIGNS AND SYMPTOMS °Symptoms of acute and chronic sinusitis are the same. The primary symptoms are pain and pressure around the affected sinuses. Other symptoms include: °· Upper toothache. °· Earache. °· Headache. °· Bad breath. °· Decreased sense of smell and taste. °· A cough, which worsens when you are lying flat. °· Fatigue. °· Fever. °· Thick drainage from your nose, which often is green and may contain pus (purulent). °· Swelling and warmth over the affected sinuses. °DIAGNOSIS °Your health care provider will perform a physical exam. During your exam, your health care provider may perform any of the following to help determine if you have acute sinusitis or chronic sinusitis: °· Look in your nose for signs of abnormal growths in your nostrils (nasal polyps). °· Tap over the affected sinus to check for signs of  infection. °· View the inside of your sinuses using an imaging device that has a light attached (endoscope). °If your health care provider suspects that you have chronic sinusitis, one or more of the following tests may be recommended: °· Allergy tests. °· Nasal culture. A sample of mucus is taken from your nose, sent to a lab, and screened for bacteria. °· Nasal cytology. A sample of mucus is taken from your nose and examined by your health care provider to determine if your sinusitis is related to an allergy. °TREATMENT °Most cases of acute sinusitis are related to a viral infection and will resolve on their own within 10 days. Sometimes, medicines are prescribed to help relieve symptoms of both acute and chronic sinusitis. These may include pain medicines, decongestants, nasal steroid sprays, or saline sprays. °However, for sinusitis related to a bacterial infection, your health care provider will prescribe antibiotic medicines. These are medicines that will help kill the bacteria causing the infection. °Rarely, sinusitis is caused by a fungal infection. In these cases, your health care provider will prescribe antifungal medicine. °For some cases of chronic sinusitis, surgery is needed. Generally, these are cases in which sinusitis recurs more than 3 times per year, despite other treatments. °HOME CARE INSTRUCTIONS °· Drink plenty of water. Water helps thin the mucus so your sinuses can drain more easily. °· Use a humidifier. °· Inhale steam 3-4 times a day (for example, sit in the bathroom with the shower running). °· Apply a warm, moist washcloth to your face 3-4 times a day, or as directed by your health care provider. °· Use saline nasal sprays to help   moisten and clean your sinuses.  Take medicines only as directed by your health care provider.  If you were prescribed either an antibiotic or antifungal medicine, finish it all even if you start to feel better. SEEK IMMEDIATE MEDICAL CARE IF:  You have  increasing pain or severe headaches.  You have nausea, vomiting, or drowsiness.  You have swelling around your face.  You have vision problems.  You have a stiff neck.  You have difficulty breathing.   This information is not intended to replace advice given to you by your health care provider. Make sure you discuss any questions you have with your health care provider.   Document Released: 03/23/2005 Document Revised: 04/13/2014 Document Reviewed: 04/07/2011 Elsevier Interactive Patient Education 2016 [employer].  Sinusitis, Adult Sinusitis is redness, soreness, and puffiness (inflammation) of the air pockets in the bones of your face (sinuses). The redness, soreness, and puffiness can cause air and mucus to get trapped in your sinuses. This can allow germs to grow and cause an infection.  HOME CARE   Drink enough fluids to keep your pee (urine) clear or pale yellow.  Use a humidifier in your home.  Run a hot shower to create steam in the bathroom. Sit in the bathroom with the door closed. Breathe in the steam 3-4 times a day.  Put a warm, moist washcloth on your face 3-4 times a day, or as told by your doctor.  Use salt water sprays (saline sprays) to wet the thick fluid in your nose. This can help the sinuses drain.  Only take medicine as told by your doctor. GET HELP RIGHT AWAY IF:   Your pain gets worse.  You have very bad headaches.  You are sick to your stomach (nauseous).  You throw up (vomit).  You are very sleepy (drowsy) all the time.  Your face is puffy (swollen).  Your vision changes.  You have a stiff neck.  You have trouble breathing. MAKE SURE YOU:   Understand these instructions.  Will watch your condition.  Will get help right away if you are not doing well or get worse.   This information is not intended to replace advice given to you by your health care provider. Make sure you discuss any questions you have with your health care  provider.   Document Released: 09/09/2007 Document Revised: 04/13/2014 Document Reviewed: 10/27/2011 Elsevier Interactive Patient Education 2016 [employer].  May take OTC meds as label directed (tylenol/ibuprofen, chloraseptic). Rest,push fluids, take antibiotic as directed. F/u with your PCP for recheck. Return as needed

## 2015-01-14 NOTE — ED Notes (Signed)
Pt has been suffering from sinus, head, and nasal congestion along with chills, fatigue, nausea, cough, and neck pain for 4-5 days.  She denies any fever the she is aware of.  She has tried several OTC medications with no relief.

## 2015-02-16 ENCOUNTER — Encounter (HOSPITAL_COMMUNITY): Payer: Self-pay | Admitting: *Deleted

## 2015-02-16 ENCOUNTER — Emergency Department (INDEPENDENT_AMBULATORY_CARE_PROVIDER_SITE_OTHER)
Admission: EM | Admit: 2015-02-16 | Discharge: 2015-02-16 | Disposition: A | Discharge status: Home / Self Care | Payer: PRIVATE HEALTH INSURANCE | Source: Home / Self Care | Attending: Family Medicine | Admitting: Family Medicine

## 2015-02-16 DIAGNOSIS — L239 Allergic contact dermatitis, unspecified cause: Secondary | ICD-10-CM

## 2015-02-16 MED ORDER — TRIAMCINOLONE ACETONIDE 40 MG/ML IJ SUSP
40.0000 mg | Freq: Once | INTRAMUSCULAR | Status: AC
  Administered 2015-02-16: 40 mg via INTRAMUSCULAR

## 2015-02-16 MED ORDER — HYDROXYZINE HCL 25 MG PO TABS: 25.0000 mg | ORAL_TABLET | Freq: Four times a day (QID) | ORAL | Status: DC

## 2015-02-16 MED ORDER — MOMETASONE FUROATE 0.1 % EX SOLN: Freq: Every day | CUTANEOUS | Status: DC

## 2015-02-16 MED ORDER — TRIAMCINOLONE ACETONIDE 40 MG/ML IJ SUSP
INTRAMUSCULAR | Status: AC
  Filled 2015-02-16: qty 1

## 2015-02-16 NOTE — ED Provider Notes (Signed)
CSN: [id number]     Arrival date & time 02/16/15  1313 History   First MD Initiated Contact with Patient 02/16/15 1433     Chief Complaint  Patient presents with  . Rash   (Consider location/radiation/quality/duration/timing/severity/associated sxs/prior Treatment) Patient is a 29 y.o. female presenting with rash. The history is provided by the patient.  Rash Location:  Leg Leg rash location:  L upper leg, R upper leg, L lower leg and R lower leg Quality: itchiness   Quality: not painful, not peeling and not red   Severity:  Mild Onset quality:  Sudden Duration: rash present this am on awakening. Chronicity:  New   Past Medical History  Diagnosis Date  . Acne   . Eczema   . Pelvic pain in female   . Irregular menstrual cycle   . Hyperlipidemia    Past Surgical History  Procedure Laterality Date  . No past surgeries    . Laparoscopy N/A 11/13/2014    Procedure: LAPAROSCOPY DIAGNOSTIC WITH PELVIC BIOPSY;  Surgeon: Ena Dawley, MD;  Location: [facility];  Service: Gynecology;  Laterality: N/A;  . Lysis of adhesion N/A 11/13/2014    Procedure: LYSIS OF ADHESION;  Surgeon: Ena Dawley, MD;  Location: [facility];  Service: Gynecology;  Laterality: N/A;   Family History  Problem Relation Age of Onset  . Diabetes Brother   . Stroke Brother   . Heart failure Brother    Social History  Substance Use Topics  . Smoking status: Never Smoker   . Smokeless tobacco: Never Used  . Alcohol Use: No   OB History    No data available     Review of Systems  Constitutional: Negative.   Gastrointestinal: Negative.   Musculoskeletal: Negative.   Skin: Positive for rash.  All other systems reviewed and are negative.   Allergies  Review of patient's allergies indicates no known allergies.  Home Medications   Prior to Admission medications   Medication Sig Start Date End Date Taking? Authorizing Provider  amoxicillin-clavulanate (AUGMENTIN)  875-125 MG tablet Take 1 tablet by mouth every 12 (twelve) hours. [id number]   Tori Milks, NP  hydrOXYzine (ATARAX/VISTARIL) 25 MG tablet Take 1 tablet (25 mg total) by mouth every 6 (six) hours. For itching 02/16/15   Billy Fischer, MD  ibuprofen (ADVIL,MOTRIN) 800 MG tablet Take 1 tablet (800 mg total) by mouth every 8 (eight) hours as needed. 11/13/14   Ena Dawley, MD  medroxyPROGESTERone (PROVERA) 10 MG tablet Take 10 mg by mouth daily.    Historical Provider, MD  mometasone (ELOCON) 0.1 % lotion Apply topically daily. At hs. 02/16/15   Billy Fischer, MD  oxyCODONE-acetaminophen (ROXICET) 5-325 MG per tablet Take 1 tablet by mouth every 4 (four) hours as needed for severe pain. 11/13/14   Ena Dawley, MD  promethazine (PHENERGAN) 12.5 MG tablet Take 1 tablet (12.5 mg total) by mouth every 6 (six) hours as needed for nausea or vomiting. 11/13/14   Ena Dawley, MD   Meds Ordered and Administered this Visit   Medications  triamcinolone acetonide (KENALOG-40) injection 40 mg (not administered)    BP 138/76 mmHg  Pulse 72  Temp(Src) 98.3 F (36.8 C) (Oral)  SpO2 100%  LMP 01/20/2015 No data found.   Physical Exam  Constitutional: She is oriented to person, place, and time. She appears well-developed and well-nourished.  Abdominal: Soft. Bowel sounds are normal.  Musculoskeletal: She exhibits no tenderness.  Neurological: She is alert and  oriented to person, place, and time.  Skin: Skin is warm and dry. Rash noted.  Pruritic papular scattered lesions on bilat lower legs, nonpustular, nonvesicular.  Nursing note and vitals reviewed.   ED Course  Procedures (including critical care time)  Labs Review Labs Reviewed - No data to display  Imaging Review No results found.   Visual Acuity Review  Right Eye Distance:   Left Eye Distance:   Bilateral Distance:    Right Eye Near:   Left Eye Near:    Bilateral Near:         MDM   1. Allergic contact  dermatitis        Billy Fischer, MD 02/16/15 1505

## 2015-02-16 NOTE — ED Notes (Signed)
Pt   Reports    Rash    Red  Itchy     Rash      On legs     Symptoms        X      sev  Days          No angioedema     Speaking  In   Complete  sentances  And  Is in no  Severe  Distress

## 2015-04-12 ENCOUNTER — Other Ambulatory Visit: Payer: Self-pay | Admitting: Obstetrics and Gynecology

## 2015-04-12 DIAGNOSIS — R110 Nausea: Secondary | ICD-10-CM

## 2015-04-18 ENCOUNTER — Inpatient Hospital Stay: Admission: RE | Admit: 2015-04-18 | Payer: PRIVATE HEALTH INSURANCE | Source: Ambulatory Visit

## 2015-06-27 ENCOUNTER — Ambulatory Visit
Admission: RE | Admit: 2015-06-27 | Discharge: 2015-06-27 | Disposition: A | Discharge status: Home / Self Care | Payer: PRIVATE HEALTH INSURANCE | Source: Ambulatory Visit | Attending: Obstetrics and Gynecology | Admitting: Obstetrics and Gynecology

## 2015-06-27 DIAGNOSIS — R110 Nausea: Secondary | ICD-10-CM

## 2016-02-04 ENCOUNTER — Encounter (HOSPITAL_COMMUNITY): Payer: Self-pay | Admitting: Family Medicine

## 2016-02-04 ENCOUNTER — Ambulatory Visit (HOSPITAL_COMMUNITY)
Admission: EM | Admit: 2016-02-04 | Discharge: 2016-02-04 | Disposition: A | Discharge status: Home / Self Care | Payer: 59 | Attending: Family Medicine | Admitting: Family Medicine

## 2016-02-04 DIAGNOSIS — B349 Viral infection, unspecified: Secondary | ICD-10-CM | POA: Diagnosis not present

## 2016-02-04 MED ORDER — ONDANSETRON 8 MG PO TBDP: 8.0000 mg | ORAL_TABLET | Freq: Three times a day (TID) | ORAL | 0 refills | Status: DC | PRN

## 2016-02-04 MED ORDER — HYDROCODONE-HOMATROPINE 5-1.5 MG/5ML PO SYRP: 5.0000 mL | ORAL_SOLUTION | Freq: Four times a day (QID) | ORAL | 0 refills | Status: DC | PRN

## 2016-02-04 NOTE — ED Provider Notes (Signed)
[place]    CSN: [id number] Arrival date & time: 02/04/16  1611     History   Chief Complaint Chief Complaint  Patient presents with  . Nausea    HPI Colin Puck is a 30 y.o. female.   This is a 30 year old woman comes in with flu symptoms. Her problems began last Thursday, 5 days ago, with loose stools and nausea. She's developed a cough since.  Patient works in early childhood education. She has not missed work.      Past Medical History:  Diagnosis Date  . Acne   . Eczema   . Hyperlipidemia   . Irregular menstrual cycle   . Pelvic pain in female     There are no active problems to display for this patient.   Past Surgical History:  Procedure Laterality Date  . LAPAROSCOPY N/A 11/13/2014   Procedure: LAPAROSCOPY DIAGNOSTIC WITH PELVIC BIOPSY;  Surgeon: Ena Dawley, MD;  Location: [facility];  Service: Gynecology;  Laterality: N/A;  . LYSIS OF ADHESION N/A 11/13/2014   Procedure: LYSIS OF ADHESION;  Surgeon: Ena Dawley, MD;  Location: [facility];  Service: Gynecology;  Laterality: N/A;  . NO PAST SURGERIES      OB History    No data available       Home Medications    Prior to Admission medications   Medication Sig Start Date End Date Taking? Authorizing Provider  HYDROcodone-homatropine (HYCODAN) 5-1.5 MG/5ML syrup Take 5 mLs by mouth every 6 (six) hours as needed for cough. 02/04/16   Robyn Haber, MD  ibuprofen (ADVIL,MOTRIN) 800 MG tablet Take 1 tablet (800 mg total) by mouth every 8 (eight) hours as needed. 11/13/14   Ena Dawley, MD  medroxyPROGESTERone (PROVERA) 10 MG tablet Take 10 mg by mouth daily.    Historical Provider, MD  mometasone (ELOCON) 0.1 % lotion Apply topically daily. At hs. 02/16/15   Billy Fischer, MD  ondansetron (ZOFRAN-ODT) 8 MG disintegrating tablet Take 1 tablet (8 mg total) by mouth every 8 (eight) hours as needed for nausea. 02/04/16   Robyn Haber, MD     Family History Family History  Problem Relation Age of Onset  . Diabetes Brother   . Stroke Brother   . Heart failure Brother     Social History Social History  Substance Use Topics  . Smoking status: Never Smoker  . Smokeless tobacco: Never Used  . Alcohol use No     Allergies   Review of patient's allergies indicates no known allergies.   Review of Systems Review of Systems  Constitutional: Negative.   HENT: Negative.   Eyes: Negative.   Respiratory: Positive for cough.   Gastrointestinal: Positive for diarrhea, nausea and vomiting.  Genitourinary: Negative.      Physical Exam Triage Vital Signs ED Triage Vitals [02/04/16 1634]  Enc Vitals Group     BP 145/77     Pulse Rate 96     Resp 16     Temp 98 F (36.7 C)     Temp Source Oral     SpO2 100 %     Weight      Height      Head Circumference      Peak Flow      Pain Score      Pain Loc      Pain Edu?      Excl. in [place]?    No data found.   Updated Vital Signs  BP 145/77 (BP Location: Left Arm)   Pulse 96   Temp 98 F (36.7 C) (Oral)   Resp 16   SpO2 100%   Physical Exam  Constitutional: She is oriented to person, place, and time. She appears well-developed and well-nourished.  HENT:  Head: Normocephalic.  Right Ear: External ear normal.  Left Ear: External ear normal.  Mouth/Throat: Oropharynx is clear and moist.  Eyes: Conjunctivae and EOM are normal. Pupils are equal, round, and reactive to light.  Neck: Normal range of motion. Neck supple.  Cardiovascular: Normal rate.   Pulmonary/Chest: Effort normal and breath sounds normal.  Abdominal: Soft. Bowel sounds are normal. She exhibits no distension. There is no tenderness.  Musculoskeletal: Normal range of motion.  Neurological: She is alert and oriented to person, place, and time.  Skin: Skin is warm and dry.  Nursing note and vitals reviewed.    UC Treatments / Results  Labs (all labs ordered are listed, but only abnormal  results are displayed) Labs Reviewed - No data to display  EKG  EKG Interpretation None       Radiology No results found.  Procedures Procedures (including critical care time)  Medications Ordered in UC Medications - No data to display   Initial Impression / Assessment and Plan / UC Course  I have reviewed the triage vital signs and the nursing notes.  Pertinent labs & imaging results that were available during my care of the patient were reviewed by me and considered in my medical decision making (see chart for details).  Clinical Course     Final Clinical Impressions(s) / UC Diagnoses   Final diagnoses:  Acute viral syndrome    New Prescriptions New Prescriptions   HYDROCODONE-HOMATROPINE (HYCODAN) 5-1.5 MG/5ML SYRUP    Take 5 mLs by mouth every 6 (six) hours as needed for cough.   ONDANSETRON (ZOFRAN-ODT) 8 MG DISINTEGRATING TABLET    Take 1 tablet (8 mg total) by mouth every 8 (eight) hours as needed for nausea.     Robyn Haber, MD 02/04/16 [phone]

## 2016-02-04 NOTE — Discharge Instructions (Signed)
Please return if your symptoms worsen or you fail to improve in the next 48 hours

## 2016-02-04 NOTE — ED Triage Notes (Signed)
The patient presented to the [facility] with a complaint of N/V/D that started 6 days ago. 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

## 2016-02-18 ENCOUNTER — Emergency Department (HOSPITAL_COMMUNITY): Payer: 59

## 2016-02-18 ENCOUNTER — Emergency Department (HOSPITAL_COMMUNITY)
Admission: EM | Admit: 2016-02-18 | Discharge: 2016-02-18 | Disposition: A | Discharge status: Home / Self Care | Payer: 59 | Attending: Physician Assistant | Admitting: Physician Assistant

## 2016-02-18 DIAGNOSIS — K5900 Constipation, unspecified: Secondary | ICD-10-CM | POA: Insufficient documentation

## 2016-02-18 LAB — URINALYSIS, ROUTINE W REFLEX MICROSCOPIC
BILIRUBIN URINE: NEGATIVE
Glucose, UA: NEGATIVE mg/dL
Hgb urine dipstick: NEGATIVE
Ketones, ur: NEGATIVE mg/dL
LEUKOCYTES UA: NEGATIVE
NITRITE: NEGATIVE
PH: 6.5 (ref 5.0–8.0)
Protein, ur: NEGATIVE mg/dL
SPECIFIC GRAVITY, URINE: 1.022 (ref 1.005–1.030)

## 2016-02-18 LAB — POC URINE PREG, ED: Preg Test, Ur: NEGATIVE

## 2016-02-18 MED ORDER — CALCIUM POLYCARBOPHIL 625 MG PO TABS: 625.0000 mg | ORAL_TABLET | Freq: Every day | ORAL | 2 refills | Status: DC

## 2016-02-18 MED ORDER — POLYETHYLENE GLYCOL 3350 17 G PO PACK: 17.0000 g | PACK | Freq: Every day | ORAL | 0 refills | Status: DC

## 2016-02-18 MED ORDER — POLYETHYLENE GLYCOL 3350 17 G PO PACK
17.0000 g | PACK | Freq: Once | ORAL | Status: AC
  Administered 2016-02-18: 17 g via ORAL
  Filled 2016-02-18: qty 1

## 2016-02-18 NOTE — Discharge Instructions (Signed)
You have been seen today for constipation. Your imaging and lab tests showed no abnormalities. Follow up with PCP for chronic management of this issue. Return to ED should symptoms worsen.  Taking MiraLAX daily until you are having at least 2 soft stools a day. Permanently increase your water intake to at least 64-72 ounces a day. Increase your dietary fiber. Increasing fiber and water consumption helps prevent constipation.

## 2016-02-18 NOTE — ED Triage Notes (Addendum)
BIB EMS from [place] w/ c/o constipation x1 week. Pt arrives A+OX4, speaking in complete sentences, ambulatory to triage.    EMS Vitals BP 190/110 P 104 SPO2 98% RR 18

## 2016-02-18 NOTE — ED Notes (Signed)
Provided prune juice for patient to drink.

## 2016-02-18 NOTE — ED Provider Notes (Signed)
[facility] DEPT Provider Note   CSN: [id number] Arrival date & time: 02/18/16  1718  By signing my name below, I, Soijett Blue, attest that this documentation has been prepared under the direction and in the presence of Shawn Joy, PA-C Electronically Signed: Soijett Blue, ED Scribe. 02/18/16. 10:01 PM.   History   Chief Complaint Chief Complaint  Patient presents with  . Constipation    HPI Elizabeth Ferguson is a 30 y.o. female who presents to the Emergency Department brought in by EMS complaining of recurrent constipation onset 1 year ago worsening for the past week. Pt notes she was able to have a bowel movement today, but it was small pellets. Endorses poor water and fiber intake. Generalized abdominal discomfort, intermittent, mild to moderate, nonradiating. She notes that she has not tried any medications for the relief of her symptoms. Denies fever/chills, vomiting/diarrhea, hematochezia, or any other complaints.    Pt notes that her OB-GYN is Dr. Raphael Gibney at [facility] and she has an appointment with them on 03/10/2016. Pt denies having a PCP at this time. Pt denies having menstrual cycles due to the contraception that she is taking.   The history is provided by the patient. No language interpreter was used.    Past Medical History:  Diagnosis Date  . Acne   . Eczema   . Hyperlipidemia   . Irregular menstrual cycle   . Pelvic pain in female     There are no active problems to display for this patient.   Past Surgical History:  Procedure Laterality Date  . LAPAROSCOPY N/A 11/13/2014   Procedure: LAPAROSCOPY DIAGNOSTIC WITH PELVIC BIOPSY;  Surgeon: Ena Dawley, MD;  Location: [facility];  Service: Gynecology;  Laterality: N/A;  . LYSIS OF ADHESION N/A 11/13/2014   Procedure: LYSIS OF ADHESION;  Surgeon: Ena Dawley, MD;  Location: [facility];  Service: Gynecology;  Laterality: N/A;  . NO PAST SURGERIES      OB History     No data available       Home Medications    Prior to Admission medications   Medication Sig Start Date End Date Taking? Authorizing Provider  medroxyPROGESTERone (PROVERA) 10 MG tablet Take 10 mg by mouth daily.   Yes Historical Provider, MD  Norethindrone Acetate-Ethinyl Estrad-FE (BLISOVI 24 FE) 1-20 MG-MCG(24) tablet Take 1 tablet by mouth daily. 10/24/15  Yes Historical Provider, MD  vitamin B-12 (CYANOCOBALAMIN) 1000 MCG tablet Take 1,000 mcg by mouth daily.   Yes Historical Provider, MD  HYDROcodone-homatropine (HYCODAN) 5-1.5 MG/5ML syrup Take 5 mLs by mouth every 6 (six) hours as needed for cough. Patient not taking: Reported on 02/18/2016 02/04/16   Robyn Haber, MD  ibuprofen (ADVIL,MOTRIN) 800 MG tablet Take 1 tablet (800 mg total) by mouth every 8 (eight) hours as needed. Patient not taking: Reported on 02/18/2016 11/13/14   Ena Dawley, MD  mometasone (ELOCON) 0.1 % lotion Apply topically daily. At hs. Patient not taking: Reported on 02/18/2016 02/16/15   Billy Fischer, MD  ondansetron (ZOFRAN-ODT) 8 MG disintegrating tablet Take 1 tablet (8 mg total) by mouth every 8 (eight) hours as needed for nausea. Patient not taking: Reported on 02/18/2016 02/04/16   Robyn Haber, MD    Family History Family History  Problem Relation Age of Onset  . Diabetes Brother   . Stroke Brother   . Heart failure Brother     Social History Social History  Substance Use Topics  . Smoking status: Never Smoker  .  Smokeless tobacco: Never Used  . Alcohol use No     Allergies   Patient has no known allergies.   Review of Systems Review of Systems  Constitutional: Negative for fever.  Gastrointestinal: Positive for abdominal pain (RLQ), constipation and nausea. Negative for diarrhea and vomiting.  Genitourinary: Negative for dysuria and hematuria.  All other systems reviewed and are negative.    Physical Exam Updated Vital Signs BP 146/80 (BP Location: Left Arm)    Pulse 88   Resp 18   LMP  (LMP Unknown) Comment: neg preg test  SpO2 100%   Physical Exam  Constitutional: She appears well-developed and well-nourished. No distress.  HENT:  Head: Normocephalic and atraumatic.  Eyes: Conjunctivae are normal.  Neck: Neck supple.  Cardiovascular: Normal rate, regular rhythm, normal heart sounds and intact distal pulses.   Pulmonary/Chest: Effort normal and breath sounds normal. No respiratory distress.  Abdominal: Soft. Bowel sounds are normal. She exhibits no distension and no mass. There is no tenderness. There is no guarding.  Genitourinary:  Genitourinary Comments: Scribe chaperone present for exam. No external hemorrhoids, fissures, or lesions noted. No gross blood or stool burden. No rectal tenderness.   Musculoskeletal: She exhibits no edema.  Lymphadenopathy:    She has no cervical adenopathy.  Neurological: She is alert.  Skin: Skin is warm and dry. She is not diaphoretic.  Psychiatric: She has a normal mood and affect. Her behavior is normal.  Nursing note and vitals reviewed.   ED Treatments / Results  DIAGNOSTIC STUDIES: Oxygen Saturation is 100% on RA, nl by my interpretation.    COORDINATION OF CARE: 10:01 PM Discussed treatment plan with pt at bedside which includes abdominal acute with chest xray, UA, miralax, and pt agreed to plan.   Labs (all labs ordered are listed, but only abnormal results are displayed) Labs Reviewed  URINALYSIS, ROUTINE W REFLEX MICROSCOPIC (NOT AT [facility])  POC URINE PREG, ED    Radiology Dg Abdomen Acute W/chest  Result Date: 02/18/2016 CLINICAL DATA:  30 y/o F; 1 year of constipation worse in the last week. History of cholecystectomy and endometriosis. EXAM: DG ABDOMEN ACUTE W/ 1V CHEST COMPARISON:  None. FINDINGS: There is no evidence of dilated bowel loops or free intraperitoneal air. No radiopaque calculi or other significant radiographic abnormality is seen. Heart size and mediastinal contours are  within normal limits. Both lungs are clear. Right upper quadrant cholecystectomy clips. Mild levocurvature of the lumbar spine with apex at L3. IMPRESSION: Normal bowel gas pattern.  No acute cardiopulmonary disease. Electronically Signed   By: Kristine Garbe M.D.   On: 02/18/2016 22:43    Procedures Procedures (including critical care time)  Medications Ordered in ED Medications  polyethylene glycol (MIRALAX / GLYCOLAX) packet 17 g (17 g Oral Given 02/18/16 2210)     Initial Impression / Assessment and Plan / ED Course  I have reviewed the triage vital signs and the nursing notes.  Pertinent labs & imaging results that were available during my care of the patient were reviewed by me and considered in my medical decision making (see chart for details).  Clinical Course     Patient presents with constipation worsening over the last week. Presentation is not consistent with bowel obstruction. Patient is nontoxic appearing and vital signs are within normal limits. PCP follow-up. The patient was given instructions for home care as well as return precautions. Patient voices understanding of these instructions, accepts the plan, and is comfortable with discharge.  Vitals:   02/18/16 2130 02/18/16 2322  BP: 146/80 108/95  Pulse: 88 98  Resp: 18 20  Temp:  98.8 F (37.1 C)  TempSrc:  Oral  SpO2: 100% 100%     Final Clinical Impressions(s) / ED Diagnoses   Final diagnoses:  Constipation, unspecified constipation type    New Prescriptions New Prescriptions   No medications on file   I personally performed the services described in this documentation, which was scribed in my presence. The recorded information has been reviewed and is accurate.    Lorayne Bender, PA-C 02/20/16 [place], MD 02/21/16 1334

## 2016-05-27 ENCOUNTER — Encounter (HOSPITAL_COMMUNITY): Payer: Self-pay | Admitting: *Deleted

## 2016-05-27 ENCOUNTER — Ambulatory Visit (HOSPITAL_COMMUNITY)
Admission: EM | Admit: 2016-05-27 | Discharge: 2016-05-27 | Disposition: A | Discharge status: Home / Self Care | Payer: BLUE CROSS/BLUE SHIELD | Attending: Family Medicine | Admitting: Family Medicine

## 2016-05-27 DIAGNOSIS — S0990XA Unspecified injury of head, initial encounter: Secondary | ICD-10-CM

## 2016-05-27 NOTE — ED Triage Notes (Signed)
Pt  States    She  Hit  Her  Head  On  A   Wooden  Chair  today  While  At   Work      At   [employer]    She  Reports  Pain  r  Side  Of  Head  No  Bleeding       No  Vomiting        Awake  And   Alert   Reports  Bilateral  Earache  As   Well

## 2016-05-27 NOTE — Discharge Instructions (Signed)
Ice and tylenol or advil for soreness as neeeded, ok to work as scheduled.

## 2016-05-27 NOTE — ED Provider Notes (Signed)
[facility]    CSN: [id number] Arrival date & time: 05/27/16  1813     History   Chief Complaint Chief Complaint  Patient presents with  . Head Injury    HPI Elizabeth Ferguson is a 31 y.o. female.   The history is provided by the patient.  Head Injury  Location:  R parietal Mechanism of injury: direct blow   Mechanism of injury comment:  When straightening up pt hit head on rocking chair, no loc or bleeding, worked remainder of day without problems Pain details:    Quality:  Dull   Severity:  Mild   Progression:  Unchanged Chronicity:  New Relieved by:  None tried Worsened by:  Nothing Ineffective treatments:  None tried Associated symptoms: no blurred vision, no disorientation, no headaches and no loss of consciousness   Risk factors: occupational exposure     Past Medical History:  Diagnosis Date  . Acne   . Eczema   . Hyperlipidemia   . Irregular menstrual cycle   . Pelvic pain in female     There are no active problems to display for this patient.   Past Surgical History:  Procedure Laterality Date  . LAPAROSCOPY N/A 11/13/2014   Procedure: LAPAROSCOPY DIAGNOSTIC WITH PELVIC BIOPSY;  Surgeon: Ena Dawley, MD;  Location: [facility];  Service: Gynecology;  Laterality: N/A;  . LYSIS OF ADHESION N/A 11/13/2014   Procedure: LYSIS OF ADHESION;  Surgeon: Ena Dawley, MD;  Location: [facility];  Service: Gynecology;  Laterality: N/A;  . NO PAST SURGERIES      OB History    No data available       Home Medications    Prior to Admission medications   Medication Sig Start Date End Date Taking? Authorizing Provider  HYDROcodone-homatropine (HYCODAN) 5-1.5 MG/5ML syrup Take 5 mLs by mouth every 6 (six) hours as needed for cough. Patient not taking: Reported on 02/18/2016 02/04/16   Robyn Haber, MD  ibuprofen (ADVIL,MOTRIN) 800 MG tablet Take 1 tablet (800 mg total) by mouth every 8 (eight) hours as  needed. Patient not taking: Reported on 02/18/2016 11/13/14   Ena Dawley, MD  medroxyPROGESTERone (PROVERA) 10 MG tablet Take 10 mg by mouth daily.    Historical Provider, MD  mometasone (ELOCON) 0.1 % lotion Apply topically daily. At hs. Patient not taking: Reported on 02/18/2016 02/16/15   Billy Fischer, MD  Norethindrone Acetate-Ethinyl Estrad-FE (BLISOVI 24 FE) 1-20 MG-MCG(24) tablet Take 1 tablet by mouth daily. 10/24/15   Historical Provider, MD  ondansetron (ZOFRAN-ODT) 8 MG disintegrating tablet Take 1 tablet (8 mg total) by mouth every 8 (eight) hours as needed for nausea. Patient not taking: Reported on 02/18/2016 02/04/16   Robyn Haber, MD  polycarbophil (FIBERCON) 625 MG tablet Take 1 tablet (625 mg total) by mouth daily. 02/18/16   Shawn C Joy, PA-C  polyethylene glycol (MIRALAX) packet Take 17 g by mouth daily. 02/18/16   Shawn C Joy, PA-C  vitamin B-12 (CYANOCOBALAMIN) 1000 MCG tablet Take 1,000 mcg by mouth daily.    Historical Provider, MD    Family History Family History  Problem Relation Age of Onset  . Diabetes Brother   . Stroke Brother   . Heart failure Brother     Social History Social History  Substance Use Topics  . Smoking status: Never Smoker  . Smokeless tobacco: Never Used  . Alcohol use No     Allergies   Patient has no known allergies.  Review of Systems Review of Systems  Eyes: Negative for blurred vision.  Neurological: Negative.  Negative for loss of consciousness and headaches.  All other systems reviewed and are negative.    Physical Exam Triage Vital Signs ED Triage Vitals  Enc Vitals Group     BP 05/27/16 1920 146/83     Pulse Rate 05/27/16 1920 98     Resp 05/27/16 1920 16     Temp 05/27/16 1920 98.4 F (36.9 C)     Temp Source 05/27/16 1920 Oral     SpO2 05/27/16 1920 100 %     Weight --      Height --      Head Circumference --      Peak Flow --      Pain Score 05/27/16 1922 4     Pain Loc --      Pain Edu? --       Excl. in [place]? --    No data found.   Updated Vital Signs BP 146/83 (BP Location: Right Arm)   Pulse 98   Temp 98.4 F (36.9 C) (Oral)   Resp 16   SpO2 100%   Visual Acuity Right Eye Distance:   Left Eye Distance:   Bilateral Distance:    Right Eye Near:   Left Eye Near:    Bilateral Near:     Physical Exam  Constitutional: She is oriented to person, place, and time. She appears well-developed and well-nourished.  HENT:  Head: Normocephalic and atraumatic.  Right Ear: External ear normal.  Left Ear: External ear normal.  Nose: Nose normal.  Mouth/Throat: Oropharynx is clear and moist.  Eyes: Conjunctivae and EOM are normal. Pupils are equal, round, and reactive to light.  Neck: Normal range of motion. Neck supple.  Musculoskeletal: Normal range of motion.  Lymphadenopathy:    She has no cervical adenopathy.  Neurological: She is alert and oriented to person, place, and time. No cranial nerve deficit.  Skin: Skin is warm and dry.  Nursing note and vitals reviewed.    UC Treatments / Results  Labs (all labs ordered are listed, but only abnormal results are displayed) Labs Reviewed - No data to display  EKG  EKG Interpretation None       Radiology No results found.  Procedures Procedures (including critical care time)  Medications Ordered in UC Medications - No data to display   Initial Impression / Assessment and Plan / UC Course  I have reviewed the triage vital signs and the nursing notes.  Pertinent labs & imaging results that were available during my care of the patient were reviewed by me and considered in my medical decision making (see chart for details).       Final Clinical Impressions(s) / UC Diagnoses   Final diagnoses:  Minor head injury without loss of consciousness, initial encounter    New Prescriptions New Prescriptions   No medications on file     Billy Fischer, MD 05/27/16 2002

## 2016-07-01 ENCOUNTER — Ambulatory Visit (HOSPITAL_COMMUNITY)
Admission: EM | Admit: 2016-07-01 | Discharge: 2016-07-01 | Disposition: A | Discharge status: Home / Self Care | Payer: BLUE CROSS/BLUE SHIELD | Attending: Family Medicine | Admitting: Family Medicine

## 2016-07-01 ENCOUNTER — Encounter (HOSPITAL_COMMUNITY): Payer: Self-pay | Admitting: Emergency Medicine

## 2016-07-01 DIAGNOSIS — B349 Viral infection, unspecified: Secondary | ICD-10-CM | POA: Diagnosis not present

## 2016-07-01 NOTE — Discharge Instructions (Signed)
Sudafed PE 10 mg every 4 to 6 hours as needed for congestion Allegra or Zyrtec daily as needed for drainage and runny nose. For stronger antihistamine may take Chlor-Trimeton 2 to 4 mg every 4 to 6 hours, may cause drowsiness. Saline nasal spray used frequently. Ibuprofen 600 mg every 6 hours as needed for pain, discomfort or fever. Drink plenty of fluids and stay well-hydrated. Flonase or Rhinocort nasal spray daily

## 2016-07-01 NOTE — ED Triage Notes (Signed)
Pt reports headache, chills, nausea, and abdominal pain since Sunday.

## 2016-07-01 NOTE — ED Provider Notes (Signed)
CSN: [id number]     Arrival date & time 07/01/16  1026 History   First MD Initiated Contact with Patient 07/01/16 1157     Chief Complaint  Patient presents with  . flu like symptoms   (Consider location/radiation/quality/duration/timing/severity/associated sxs/prior Treatment) 31 year old female presents to the urgent care with a 3 to four-day history of feeling cold, chest pain tickly with deep breathing, PND and sore throat, headache, cough, body aches, nausea without vomiting, fatigue and one day with diarrhea which has already passed. She has taken Alka-Seltzer cold plus that she was not cured from her illness.      Past Medical History:  Diagnosis Date  . Acne   . Eczema   . Hyperlipidemia   . Irregular menstrual cycle   . Pelvic pain in female    Past Surgical History:  Procedure Laterality Date  . LAPAROSCOPY N/A 11/13/2014   Procedure: LAPAROSCOPY DIAGNOSTIC WITH PELVIC BIOPSY;  Surgeon: Ena Dawley, MD;  Location: [facility];  Service: Gynecology;  Laterality: N/A;  . LYSIS OF ADHESION N/A 11/13/2014   Procedure: LYSIS OF ADHESION;  Surgeon: Ena Dawley, MD;  Location: [facility];  Service: Gynecology;  Laterality: N/A;  . NO PAST SURGERIES     Family History  Problem Relation Age of Onset  . Diabetes Brother   . Stroke Brother   . Heart failure Brother    Social History  Substance Use Topics  . Smoking status: Never Smoker  . Smokeless tobacco: Never Used  . Alcohol use No   OB History    No data available     Review of Systems  Constitutional: Positive for activity change, appetite change and fatigue. Negative for chills and fever.  HENT: Positive for congestion, postnasal drip, rhinorrhea, sore throat and voice change. Negative for ear pain and facial swelling.   Eyes: Negative.   Respiratory: Positive for cough. Negative for shortness of breath.   Cardiovascular: Positive for chest pain.  Genitourinary: Negative.    Musculoskeletal: Positive for myalgias. Negative for neck pain and neck stiffness.  Skin: Negative for pallor and rash.  Neurological: Positive for headaches.    Allergies  Patient has no known allergies.  Home Medications   Prior to Admission medications   Medication Sig Start Date End Date Taking? Authorizing Provider  medroxyPROGESTERone (PROVERA) 10 MG tablet Take 10 mg by mouth daily.   Yes Historical Provider, MD  Norethindrone Acetate-Ethinyl Estrad-FE (BLISOVI 24 FE) 1-20 MG-MCG(24) tablet Take 1 tablet by mouth daily. 10/24/15  Yes Historical Provider, MD  vitamin B-12 (CYANOCOBALAMIN) 1000 MCG tablet Take 1,000 mcg by mouth daily.   Yes Historical Provider, MD  polycarbophil (FIBERCON) 625 MG tablet Take 1 tablet (625 mg total) by mouth daily. 02/18/16   Shawn C Joy, PA-C  polyethylene glycol (MIRALAX) packet Take 17 g by mouth daily. 02/18/16   Shawn C Joy, PA-C   Meds Ordered and Administered this Visit  Medications - No data to display  BP (!) 155/98 (BP Location: Left Arm)   Pulse 84   Temp 98.7 F (37.1 C) (Oral)   SpO2 99%  No data found.   Physical Exam  Constitutional: She is oriented to person, place, and time. She appears well-developed and well-nourished. No distress.  HENT:  Mouth/Throat: No oropharyngeal exudate.  Bilateral TMs are mildly retracted. Oropharynx mostly clear minor injection. Mild-to-moderate amount of clear PND.  Eyes: EOM are normal.  Neck: Normal range of motion. Neck supple.  Cardiovascular: Normal  rate, regular rhythm and normal heart sounds.   Pulmonary/Chest: Effort normal and breath sounds normal. No respiratory distress. She has no wheezes. She has no rales.  Abdominal: Soft.  Mild tenderness across the mid abdomen. No rebound or guarding.  Musculoskeletal: Normal range of motion. She exhibits no edema.  Lymphadenopathy:    She has no cervical adenopathy.  Neurological: She is alert and oriented to person, place, and time.   Skin: Skin is warm and dry. No rash noted.  Psychiatric: She has a normal mood and affect.  Nursing note and vitals reviewed.   Urgent Care Course     Procedures (including critical care time)  Labs Review Labs Reviewed - No data to display  Imaging Review No results found.   Visual Acuity Review  Right Eye Distance:   Left Eye Distance:   Bilateral Distance:    Right Eye Near:   Left Eye Near:    Bilateral Near:         MDM   1. Acute viral syndrome    Sudafed PE 10 mg every 4 to 6 hours as needed for congestion Allegra or Zyrtec daily as needed for drainage and runny nose. For stronger antihistamine may take Chlor-Trimeton 2 to 4 mg every 4 to 6 hours, may cause drowsiness. Saline nasal spray used frequently. Ibuprofen 600 mg every 6 hours as needed for pain, discomfort or fever. Drink plenty of fluids and stay well-hydrated. Flonase or Rhinocort nasal spray daily    Janne Napoleon, NP 07/01/16 1211    Janne Napoleon, NP 07/01/16 1212

## 2017-01-26 ENCOUNTER — Ambulatory Visit (HOSPITAL_COMMUNITY)
Admission: EM | Admit: 2017-01-26 | Discharge: 2017-01-26 | Disposition: A | Discharge status: Home / Self Care | Payer: BLUE CROSS/BLUE SHIELD | Attending: Emergency Medicine | Admitting: Emergency Medicine

## 2017-01-26 ENCOUNTER — Encounter (HOSPITAL_COMMUNITY): Payer: Self-pay | Admitting: Emergency Medicine

## 2017-01-26 DIAGNOSIS — J111 Influenza due to unidentified influenza virus with other respiratory manifestations: Secondary | ICD-10-CM

## 2017-01-26 DIAGNOSIS — R69 Illness, unspecified: Secondary | ICD-10-CM

## 2017-01-26 NOTE — ED Triage Notes (Signed)
Pt sts HA and body aches x 2 days with cough

## 2017-01-26 NOTE — ED Provider Notes (Signed)
[place]    CSN: [id number] Arrival date & time: 01/26/17  1924     History   Chief Complaint Chief Complaint  Patient presents with  . Headache  . Generalized Body Aches    HPI Elizabeth Ferguson is a 31 y.o. female.   Elizabeth Ferguson presents with complaints of body aches, headache, productive cough, ear pressure, congestion, and abdominal cramping which started approximately 3 days ago. 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 She has not taken any medications for her symptoms. Rates pain 7/10, primarily headache. Without nausea, vomiting or diarrhea. Has been eating and drinking. Without shortness of breath or chest pain. States yesterday her chest hurt with cough. Without urinary complaints or rash.       Past Medical History:  Diagnosis Date  . Acne   . Eczema   . Hyperlipidemia   . Irregular menstrual cycle   . Pelvic pain in female     There are no active problems to display for this patient.   Past Surgical History:  Procedure Laterality Date  . LAPAROSCOPY N/A 11/13/2014   Procedure: LAPAROSCOPY DIAGNOSTIC WITH PELVIC BIOPSY;  Surgeon: Ena Dawley, MD;  Location: [facility];  Service: Gynecology;  Laterality: N/A;  . LYSIS OF ADHESION N/A 11/13/2014   Procedure: LYSIS OF ADHESION;  Surgeon: Ena Dawley, MD;  Location: [facility];  Service: Gynecology;  Laterality: N/A;  . NO PAST SURGERIES      OB History    No data available       Home Medications    Prior to Admission medications   Medication Sig Start Date End Date Taking? Authorizing Provider  medroxyPROGESTERone (PROVERA) 10 MG tablet Take 10 mg by mouth daily.    [provider]  Norethindrone Acetate-Ethinyl Estrad-FE (BLISOVI 24 FE) 1-20 MG-MCG(24) tablet Take 1 tablet by mouth daily. 10/24/15   [provider]  polycarbophil (FIBERCON) 625 MG tablet Take 1 tablet (625 mg total) by  mouth daily. 02/18/16   Joy, Shawn C, PA-C  polyethylene glycol (MIRALAX) packet Take 17 g by mouth daily. 02/18/16   Joy, Shawn C, PA-C  vitamin B-12 (CYANOCOBALAMIN) 1000 MCG tablet Take 1,000 mcg by mouth daily.    [provider]    Family History Family History  Problem Relation Age of Onset  . Diabetes Brother   . Stroke Brother   . Heart failure Brother     Social History Social History  Substance Use Topics  . Smoking status: Never Smoker  . Smokeless tobacco: Never Used  . Alcohol use No     Allergies   Patient has no known allergies.   Review of Systems Review of Systems   Physical Exam Triage Vital Signs ED Triage Vitals  Enc Vitals Group     BP 01/26/17 1944 (!) 142/70     Pulse Rate 01/26/17 1944 97     Resp 01/26/17 1944 18     Temp 01/26/17 1944 98.4 F (36.9 C)     Temp Source 01/26/17 1944 Oral     SpO2 01/26/17 1944 100 %     Weight --      Height --      Head Circumference --      Peak Flow --      Pain Score 01/26/17 1945 7     Pain Loc --      Pain Edu? --  Excl. in [place]? --    No data found.   Updated Vital Signs BP (!) 142/70 (BP Location: Left Arm)   Pulse 97   Temp 98.4 F (36.9 C) (Oral)   Resp 18   SpO2 100%   Visual Acuity Right Eye Distance:   Left Eye Distance:   Bilateral Distance:    Right Eye Near:   Left Eye Near:    Bilateral Near:     Physical Exam  Constitutional: She is oriented to person, place, and time. She appears well-developed and well-nourished. No distress.  HENT:  Right Ear: Tympanic membrane, external ear and ear canal normal.  Left Ear: Tympanic membrane, external ear and ear canal normal.  Nose: Rhinorrhea present. Right sinus exhibits no maxillary sinus tenderness and no frontal sinus tenderness. Left sinus exhibits no maxillary sinus tenderness and no frontal sinus tenderness.  Mouth/Throat: Uvula is midline, oropharynx is clear and moist and mucous membranes are normal.  Eyes:  Pupils are equal, round, and reactive to light. Conjunctivae are normal.  Neck: Normal range of motion.  Cardiovascular: Normal rate and regular rhythm.   Pulmonary/Chest: Effort normal and breath sounds normal. No respiratory distress. She has no wheezes. She exhibits no tenderness.  Abdominal: Soft. She exhibits no distension.  Lymphadenopathy:    She has no cervical adenopathy.  Neurological: She is alert and oriented to person, place, and time.  Skin: Skin is warm and dry. No rash noted.  Vitals reviewed.    UC Treatments / Results  Labs (all labs ordered are listed, but only abnormal results are displayed) Labs Reviewed - No data to display  EKG  EKG Interpretation None       Radiology No results found.  Procedures Procedures (including critical care time)  Medications Ordered in UC Medications - No data to display   Initial Impression / Assessment and Plan / UC Course  I have reviewed the triage vital signs and the nursing notes.  Pertinent labs & imaging results that were available during my care of the patient were reviewed by me and considered in my medical decision making (see chart for details).     History and physical exam consistent with viral illness. Without acute findings on exam, vitals stable, non toxic in appearance. Supportive cares recommended, push fluids. Ibuprofen for pain. Mucinex, and/or decongestants or other OTC treatments as needed for symptoms. If symptoms worsen or do not improve in the next week to return to be seen or to follow up with PCP. Patient verbalized understanding and agreeable to plan.    Final Clinical Impressions(s) / UC Diagnoses   Final diagnoses:  Influenza-like illness    New Prescriptions New Prescriptions   No medications on file     Controlled Substance Prescriptions [place] Controlled Substance Registry consulted? Not Applicable   Zigmund Gottron, NP 01/26/17 2110

## 2017-04-05 ENCOUNTER — Other Ambulatory Visit: Payer: Self-pay

## 2017-04-05 ENCOUNTER — Ambulatory Visit (HOSPITAL_COMMUNITY)
Admission: EM | Admit: 2017-04-05 | Discharge: 2017-04-05 | Disposition: A | Discharge status: Home / Self Care | Payer: BLUE CROSS/BLUE SHIELD | Attending: Physician Assistant | Admitting: Physician Assistant

## 2017-04-05 ENCOUNTER — Ambulatory Visit (INDEPENDENT_AMBULATORY_CARE_PROVIDER_SITE_OTHER): Payer: BLUE CROSS/BLUE SHIELD

## 2017-04-05 ENCOUNTER — Encounter (HOSPITAL_COMMUNITY): Payer: Self-pay | Admitting: Emergency Medicine

## 2017-04-05 DIAGNOSIS — R1084 Generalized abdominal pain: Secondary | ICD-10-CM

## 2017-04-05 DIAGNOSIS — J029 Acute pharyngitis, unspecified: Secondary | ICD-10-CM

## 2017-04-05 DIAGNOSIS — Z3202 Encounter for pregnancy test, result negative: Secondary | ICD-10-CM

## 2017-04-05 DIAGNOSIS — R300 Dysuria: Secondary | ICD-10-CM

## 2017-04-05 DIAGNOSIS — R05 Cough: Secondary | ICD-10-CM

## 2017-04-05 DIAGNOSIS — R059 Cough, unspecified: Secondary | ICD-10-CM

## 2017-04-05 LAB — POCT I-STAT, CHEM 8
BUN: 12 mg/dL (ref 6–20)
CALCIUM ION: 1.15 mmol/L (ref 1.15–1.40)
CREATININE: 0.6 mg/dL (ref 0.44–1.00)
Chloride: 100 mmol/L — ABNORMAL LOW (ref 101–111)
Glucose, Bld: 108 mg/dL — ABNORMAL HIGH (ref 65–99)
HCT: 45 % (ref 36.0–46.0)
HEMOGLOBIN: 15.3 g/dL — AB (ref 12.0–15.0)
Potassium: 6.2 mmol/L — ABNORMAL HIGH (ref 3.5–5.1)
Sodium: 136 mmol/L (ref 135–145)
TCO2: 29 mmol/L (ref 22–32)

## 2017-04-05 LAB — POCT URINALYSIS DIP (DEVICE)
Bilirubin Urine: NEGATIVE
GLUCOSE, UA: NEGATIVE mg/dL
Hgb urine dipstick: NEGATIVE
Leukocytes, UA: NEGATIVE
Nitrite: NEGATIVE
PROTEIN: NEGATIVE mg/dL
Specific Gravity, Urine: 1.02 (ref 1.005–1.030)
UROBILINOGEN UA: 1 mg/dL (ref 0.0–1.0)
pH: 6 (ref 5.0–8.0)

## 2017-04-05 LAB — POCT PREGNANCY, URINE: PREG TEST UR: NEGATIVE

## 2017-04-05 MED ORDER — NAPROXEN 500 MG PO TABS: 500.0000 mg | ORAL_TABLET | Freq: Two times a day (BID) | ORAL | 0 refills | Status: AC

## 2017-04-05 NOTE — ED Triage Notes (Signed)
Pt c/o cough x2 weeks, nausea, weak, body aches. Hot cold chills.

## 2017-04-05 NOTE — ED Provider Notes (Signed)
04/05/2017 7:13 PM   DOB: 1985/05/20 / MRN: [id number]  SUBJECTIVE:  Elizabeth Ferguson is a 31 y.o. female presenting for cough that is preceeded by cold that started about 8 days ago.  Tells me "I can feel it in the middle of my chest when I cough."  She associates abdominal cramping.  She is moving her bowels and these are non bloody.  She has a mild burn with urination but no frequency or urgency.  She takes no medications.   She has No Known Allergies.   She  has a past medical history of Acne, Eczema, Hyperlipidemia, Irregular menstrual cycle, and Pelvic pain in female.    She  reports that  has never smoked. she has never used smokeless tobacco. She reports that she does not drink alcohol or use drugs. She  has no sexual activity history on file. The patient  has a past surgical history that includes No past surgeries; laparoscopy (N/A, 11/13/2014); and Lysis of adhesion (N/A, 11/13/2014).  Her family history includes Diabetes in her brother; Heart failure in her brother; Stroke in her brother.  Review of Systems  Constitutional: Positive for chills, fever and malaise/fatigue.  HENT: Positive for congestion, ear pain, sinus pain and sore throat.   Eyes: Negative for blurred vision, double vision, photophobia, pain and discharge.  Respiratory: Positive for cough and sputum production. Negative for hemoptysis, shortness of breath and wheezing.   Cardiovascular: Negative for chest pain and leg swelling.  Gastrointestinal: Positive for abdominal pain and nausea. Negative for blood in stool, constipation, diarrhea, melena and vomiting.  Genitourinary: Positive for dysuria. Negative for flank pain, frequency, hematuria and urgency.  Skin: Negative for itching and rash.  Neurological: Positive for headaches. Negative for dizziness, tingling, tremors, sensory change, speech change, focal weakness, seizures and loss of consciousness.    OBJECTIVE:  BP (!) 147/91   Pulse 87   Temp 98.8 F (37.1  C)   Resp 16   SpO2 100%   Physical Exam  Constitutional: She is oriented to person, place, and time. She is active.  Non-toxic appearance.  Eyes: EOM are normal. Pupils are equal, round, and reactive to light.  Cardiovascular: Normal rate, regular rhythm, S1 normal, S2 normal, normal heart sounds and intact distal pulses. Exam reveals no gallop, no friction rub and no decreased pulses.  No murmur heard. Pulmonary/Chest: Effort normal. No stridor. No tachypnea. No respiratory distress. She has no wheezes. She has no rales.  Abdominal: She exhibits no distension.  Musculoskeletal: She exhibits no edema.  Neurological: She is alert and oriented to person, place, and time. She has normal strength and normal reflexes. She is not disoriented. She displays no atrophy. No cranial nerve deficit or sensory deficit. She exhibits normal muscle tone. Coordination and gait normal.  Skin: Skin is warm and dry. She is not diaphoretic. No pallor.  Psychiatric: Her behavior is normal.    Results for orders placed or performed during the hospital encounter of 04/05/17 (from the past 72 hour(s))  POCT urinalysis dip (device)     Status: Abnormal   Collection Time: 04/05/17  6:45 PM  Result Value Ref Range   Glucose, UA NEGATIVE NEGATIVE mg/dL   Bilirubin Urine NEGATIVE NEGATIVE   Ketones, ur TRACE (A) NEGATIVE mg/dL   Specific Gravity, Urine 1.020 1.005 - 1.030   Hgb urine dipstick NEGATIVE NEGATIVE   pH 6.0 5.0 - 8.0   Protein, ur NEGATIVE NEGATIVE mg/dL   Urobilinogen, UA 1.0 0.0 - 1.0 mg/dL  Nitrite NEGATIVE NEGATIVE   Leukocytes, UA NEGATIVE NEGATIVE    Comment: Biochemical Testing Only. Please order routine urinalysis from main lab if confirmatory testing is needed.  I-STAT, chem 8     Status: Abnormal   Collection Time: 04/05/17  6:45 PM  Result Value Ref Range   Sodium 136 135 - 145 mmol/L   Potassium 6.2 (H) 3.5 - 5.1 mmol/L   Chloride 100 (L) 101 - 111 mmol/L   BUN 12 6 - 20 mg/dL    Creatinine, Ser 0.60 0.44 - 1.00 mg/dL   Glucose, Bld 108 (H) 65 - 99 mg/dL   Calcium, Ion 1.15 1.15 - 1.40 mmol/L   TCO2 29 22 - 32 mmol/L   Hemoglobin 15.3 (H) 12.0 - 15.0 g/dL   HCT 45.0 36.0 - 46.0 %  Pregnancy, urine POC     Status: None   Collection Time: 04/05/17  7:07 PM  Result Value Ref Range   Preg Test, Ur NEGATIVE NEGATIVE    Comment:        THE SENSITIVITY OF THIS METHODOLOGY IS >24 mIU/mL     Dg Chest 2 View  Result Date: 04/05/2017 CLINICAL DATA:  Cough for 12 days.  Weakness.  Fever.  Nonsmoker. EXAM: CHEST  2 VIEW COMPARISON:  02/18/2016 FINDINGS: Normal heart size and pulmonary vascularity. No focal airspace disease or consolidation in the lungs. No blunting of costophrenic angles. No pneumothorax. Mediastinal contours appear intact. Surgical clips in the right upper quadrant. IMPRESSION: No active cardiopulmonary disease. Electronically Signed   By: Lucienne Capers M.D.   On: 04/05/2017 18:55    ASSESSMENT AND PLAN:  The primary encounter diagnosis was Generalized abdominal pain. A diagnosis of Cough was also pertinent to this visit. Pan positive female here with multiple symptoms out of proportion to exam.  Work up reassuring. Elevated K likely 2/2 hemolysis as her kidneys appear to be functioning normally otherwise and she does not take any medication chronically.     The patient is advised to call or return to clinic if she does not see an improvement in symptoms, or to seek the care of the closest emergency department if she worsens with the above plan.   Philis Fendt, MHS, PA-C 04/05/2017 7:13 PM    Tereasa Coop, PA-C 04/05/17 [id number]

## 2017-04-05 NOTE — Discharge Instructions (Signed)
Take the naprosyn for the cough. Lets try a colon prep for your abdominal cramping.    For constipation   Make sure you are drinking enough water daily. Make sure you are getting enough fiber in your diet - this will make you regular - you can eat high fiber foods or use metamucil as a supplement - it is really important to drink enough water when using fiber supplements.  If your stools are hard or are formed balls or you have to strain a stool softener will help - use colace 2-3 capsule a day  For gentle treatment of constipation Use Miralax 1-2 capfuls a day until your stools are soft and regular and then decrease the usage - you can use this daily  For more aggressive treatment of constipation Use 4 capfuls of Colace and 6 doses of Miralax and drink it in 2 hours - this should result in several watery stools - if it does not repeat the next day and then go to daily miralax for a week to make sure your bowels are clean and retrained to work properly  For the most aggressive treatment of constipation Use 14 capfuls of Miralax in 1 gallon of fluid (gatoraid or water work well or a combination of the two) and drink over 12h - it is ok to eat during this time and then use Miralax 1 capful daily for about 2 weeks to prevent the constipation from returning

## 2017-08-31 IMAGING — CR DG ABDOMEN ACUTE W/ 1V CHEST
3 series · 3 of 3 positions shown · non-contrast
Comparison: None.

CLINICAL DATA: 30 y/o F; 1 year of constipation worse in the last
week. History of cholecystectomy and endometriosis.

EXAM:
DG ABDOMEN ACUTE W/ 1V CHEST

[w chest pa]
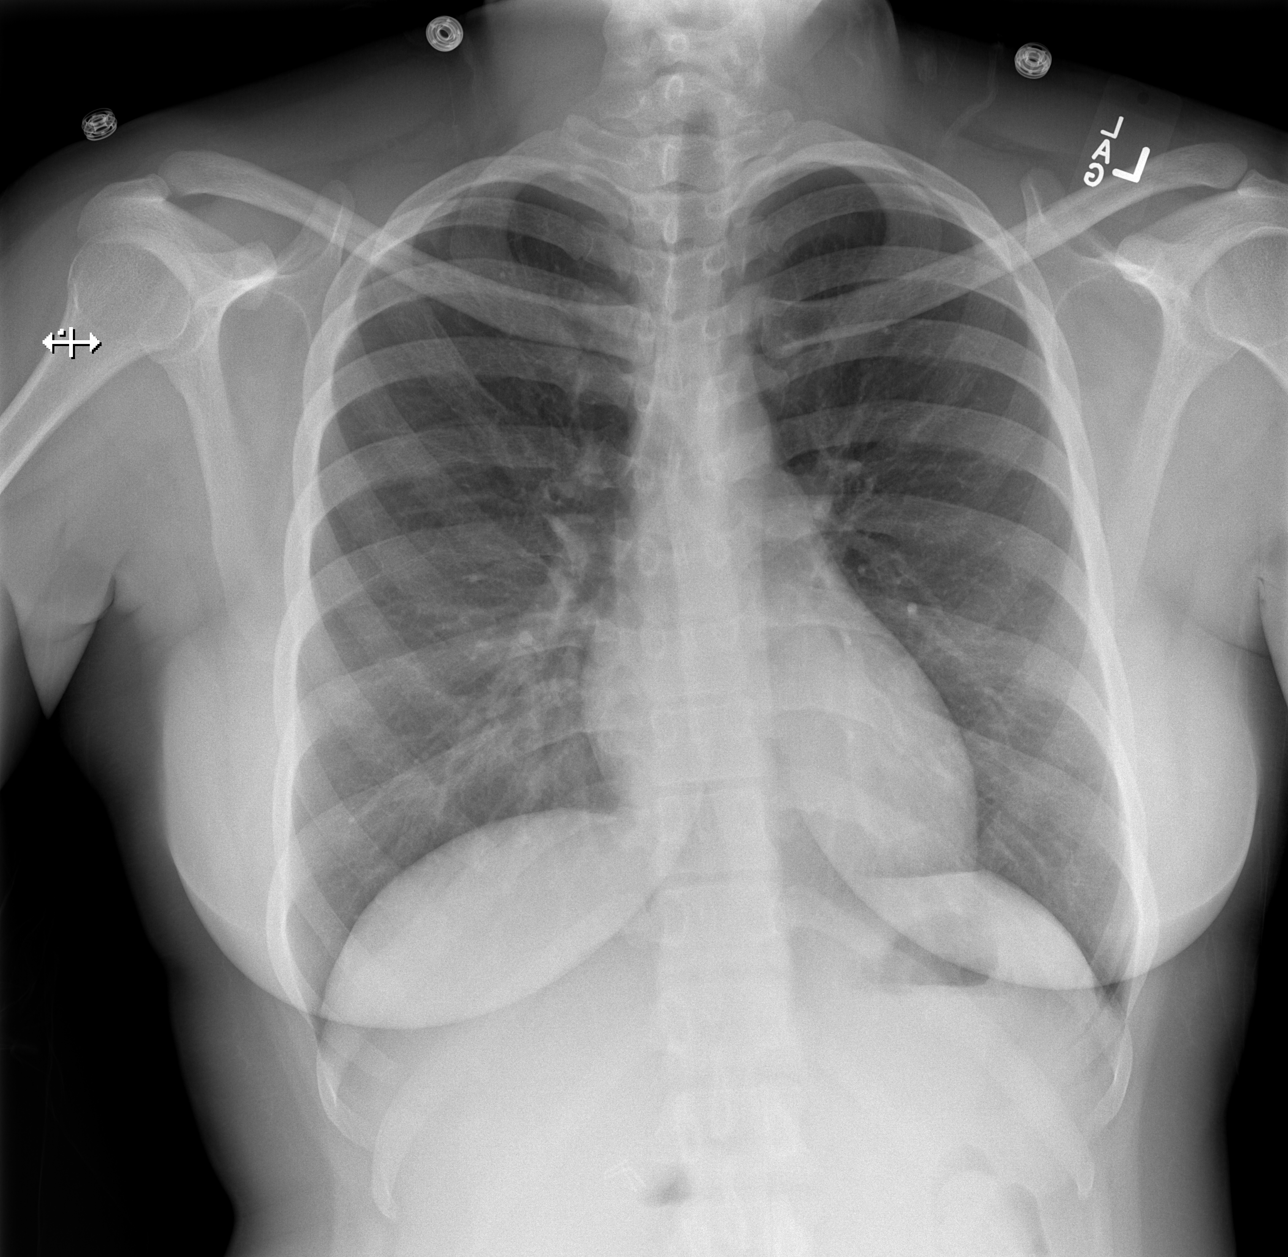

[w abdomen upright]
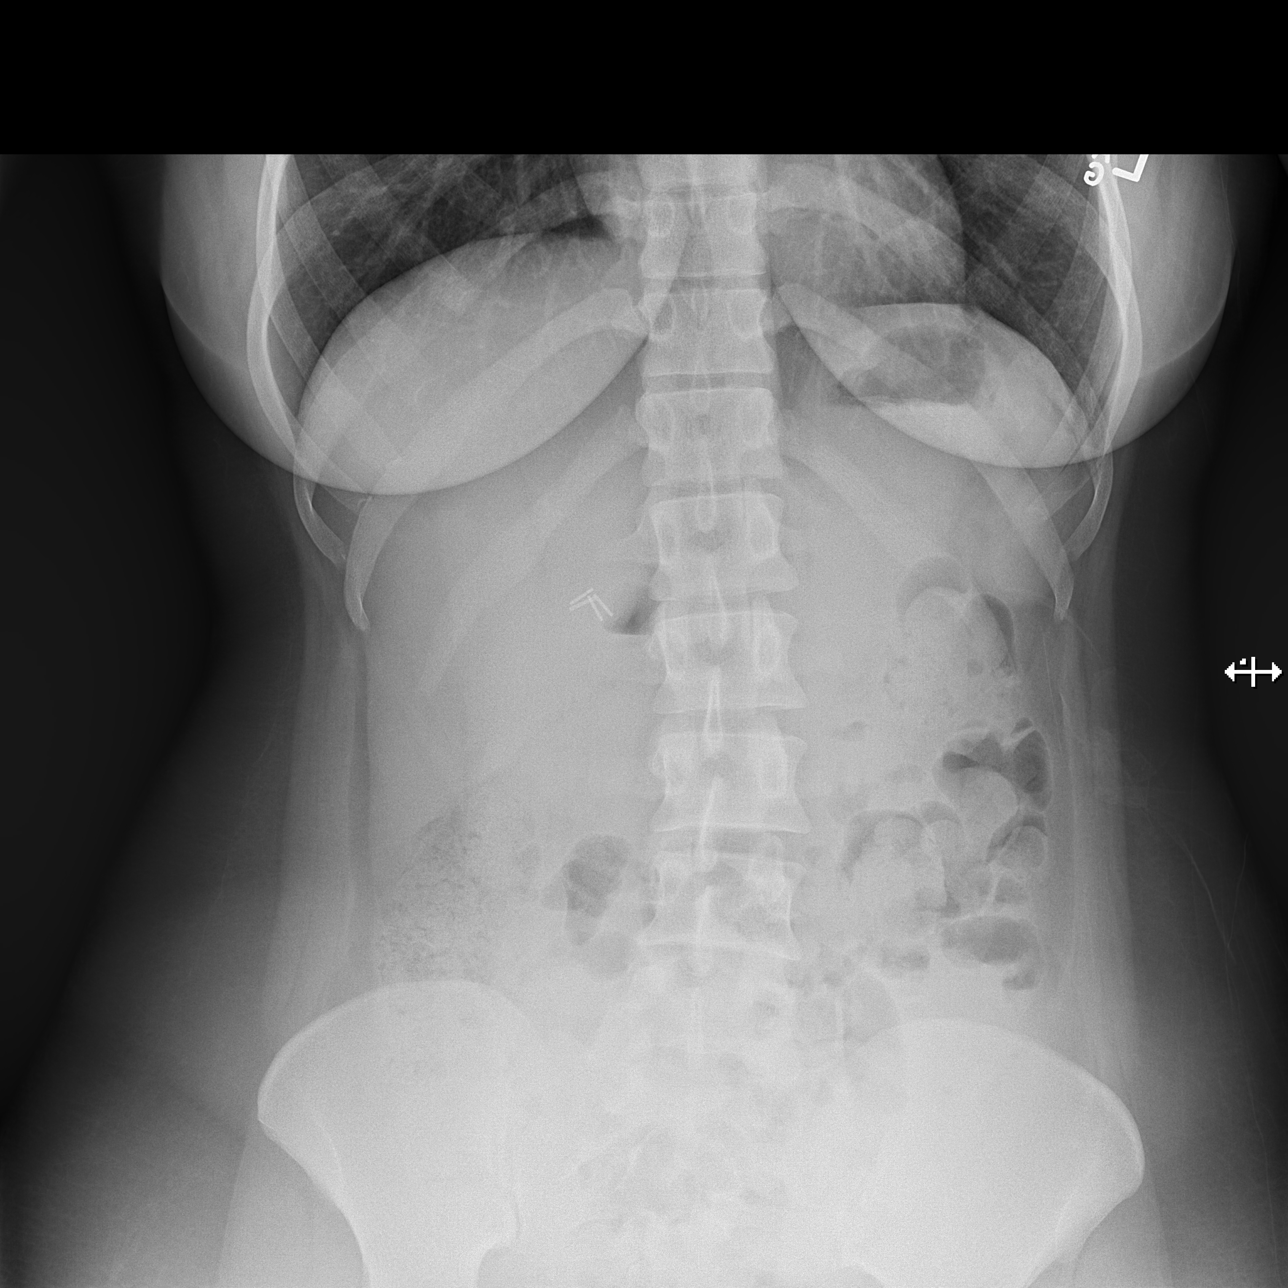

[t abdomen supine]
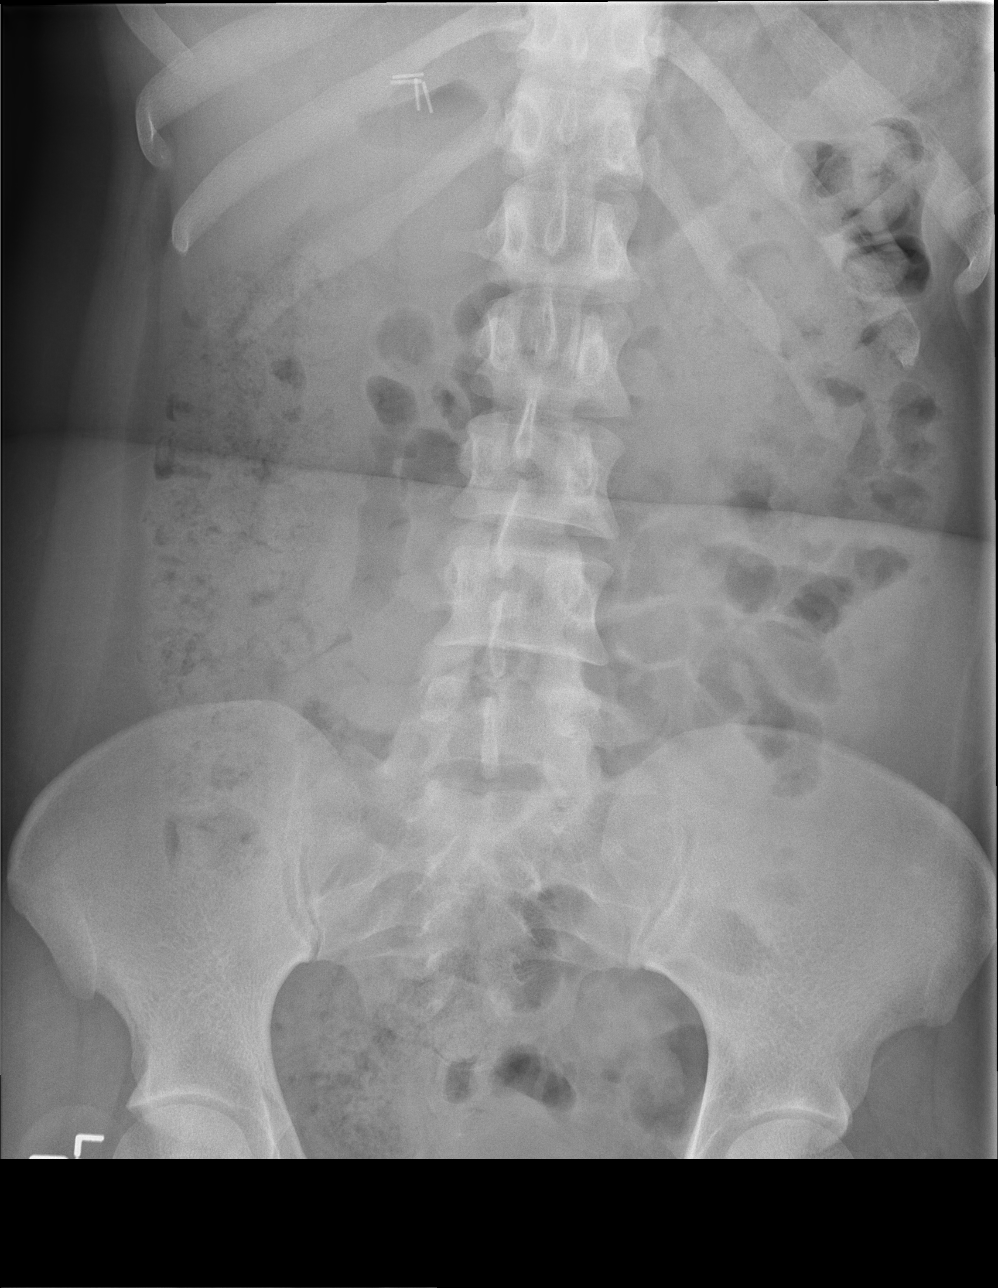

[3 of 3 positions shown; findings below may reference images not displayed]

FINDINGS: There is no evidence of dilated bowel loops or free intraperitoneal
air. No radiopaque calculi or other significant radiographic
abnormality is seen. Heart size and mediastinal contours are within
normal limits. Both lungs are clear. Right upper quadrant
cholecystectomy clips. Mild levocurvature of the lumbar spine with
apex at L3.
IMPRESSION: Normal bowel gas pattern.  No acute cardiopulmonary disease.

By: Odalys Koren M.D.

## 2017-10-20 ENCOUNTER — Ambulatory Visit (HOSPITAL_COMMUNITY)
Admission: EM | Admit: 2017-10-20 | Discharge: 2017-10-20 | Disposition: A | Discharge status: Home / Self Care | Payer: BLUE CROSS/BLUE SHIELD | Attending: Internal Medicine | Admitting: Internal Medicine

## 2017-10-20 ENCOUNTER — Encounter (HOSPITAL_COMMUNITY): Payer: Self-pay | Admitting: Emergency Medicine

## 2017-10-20 ENCOUNTER — Other Ambulatory Visit: Payer: Self-pay

## 2017-10-20 DIAGNOSIS — J069 Acute upper respiratory infection, unspecified: Secondary | ICD-10-CM

## 2017-10-20 DIAGNOSIS — B9789 Other viral agents as the cause of diseases classified elsewhere: Secondary | ICD-10-CM | POA: Diagnosis not present

## 2017-10-20 MED ORDER — IBUPROFEN 800 MG PO TABS: 800.0000 mg | ORAL_TABLET | Freq: Three times a day (TID) | ORAL | 0 refills | Status: DC

## 2017-10-20 MED ORDER — ONDANSETRON HCL 4 MG PO TABS: 4.0000 mg | ORAL_TABLET | Freq: Three times a day (TID) | ORAL | 0 refills | Status: DC | PRN

## 2017-10-20 NOTE — ED Provider Notes (Signed)
[place]    CSN: [id number] Arrival date & time: 10/20/17  1551     History   Chief Complaint Chief Complaint  Patient presents with  . URI  . Appointment    4:15 pm    HPI Trent Theisen is a 32 y.o. female.   Niani presents with complaints of runny nose, sore throat, productive cough, body aches and chills, diarrhea, fatigue, nausea, which started 7/12. Many symptoms have improved, no longer with diarrhea. Has not taken ibuprofen in two days. 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 States had an ill student last week. No vomiting. Decreased appetite but has been taking fluids. Cough remains productive. Occasionally short of breath. No history of asthma, does not smoke. States generally feels improving. Hx of acne, eczema, irregular periods.     ROS per HPI.      Past Medical History:  Diagnosis Date  . Acne   . Eczema   . Hyperlipidemia   . Irregular menstrual cycle   . Pelvic pain in female     There are no active problems to display for this patient.   Past Surgical History:  Procedure Laterality Date  . CHOLECYSTECTOMY    . LAPAROSCOPY N/A 11/13/2014   Procedure: LAPAROSCOPY DIAGNOSTIC WITH PELVIC BIOPSY;  Surgeon: Ena Dawley, MD;  Location: [facility];  Service: Gynecology;  Laterality: N/A;  . LYSIS OF ADHESION N/A 11/13/2014   Procedure: LYSIS OF ADHESION;  Surgeon: Ena Dawley, MD;  Location: [facility];  Service: Gynecology;  Laterality: N/A;  . NO PAST SURGERIES      OB History   None      Home Medications    Prior to Admission medications   Medication Sig Start Date End Date Taking? Authorizing Provider  ibuprofen (ADVIL,MOTRIN) 800 MG tablet Take 1 tablet (800 mg total) by mouth 3 (three) times daily. 10/20/17   Zigmund Gottron, NP  medroxyPROGESTERone (PROVERA) 10 MG tablet Take 10 mg by mouth daily.    [provider]  ondansetron (ZOFRAN) 4 MG tablet Take 1  tablet (4 mg total) by mouth every 8 (eight) hours as needed for nausea or vomiting. 10/20/17   Zigmund Gottron, NP    Family History Family History  Problem Relation Age of Onset  . Diabetes Brother   . Stroke Brother   . Heart failure Brother     Social History Social History   Tobacco Use  . Smoking status: Never Smoker  . Smokeless tobacco: Never Used  Substance Use Topics  . Alcohol use: No  . Drug use: No     Allergies   Patient has no known allergies.   Review of Systems Review of Systems   Physical Exam Triage Vital Signs ED Triage Vitals  Enc Vitals Group     BP 10/20/17 1618 130/86     Pulse Rate 10/20/17 1618 (!) 103     Resp 10/20/17 1618 18     Temp 10/20/17 1618 98.1 F (36.7 C)     Temp src --      SpO2 10/20/17 1618 97 %     Weight --      Height --      Head Circumference --      Peak Flow --      Pain Score 10/20/17 1615 7     Pain Loc --      Pain Edu? --      Excl. in [place]? --  No data found.  Updated Vital Signs BP 130/86 (BP Location: Left Arm)   Pulse (!) 103   Temp 98.1 F (36.7 C)   Resp 18   LMP 09/16/2017   SpO2 97%    Physical Exam  Constitutional: She is oriented to person, place, and time. She appears well-developed and well-nourished. No distress.  HENT:  Head: Normocephalic and atraumatic.  Right Ear: Tympanic membrane, external ear and ear canal normal.  Left Ear: Tympanic membrane, external ear and ear canal normal.  Nose: Nose normal.  Mouth/Throat: Uvula is midline, oropharynx is clear and moist and mucous membranes are normal. No tonsillar exudate.  Eyes: Pupils are equal, round, and reactive to light. Conjunctivae and EOM are normal.  Cardiovascular: Normal rate, regular rhythm and normal heart sounds.  Pulmonary/Chest: Effort normal and breath sounds normal.  Neurological: She is alert and oriented to person, place, and time.  Skin: Skin is warm and dry.     UC Treatments / Results  Labs (all labs  ordered are listed, but only abnormal results are displayed) Labs Reviewed - No data to display  EKG None  Radiology No results found.  Procedures Procedures (including critical care time)  Medications Ordered in UC Medications - No data to display  Initial Impression / Assessment and Plan / UC Course  I have reviewed the triage vital signs and the nursing notes.  Pertinent labs & imaging results that were available during my care of the patient were reviewed by me and considered in my medical decision making (see chart for details).     Afebrile, non toxic in appearance. Symptoms improving. History and physical consistent with viral illness.  Supportive cares recommended. Return precautions provided. If symptoms worsen or do not improve in the next week to return to be seen or to follow up with PCP. Patient verbalized understanding and agreeable to plan.    Final Clinical Impressions(s) / UC Diagnoses   Final diagnoses:  Viral URI with cough     Discharge Instructions     Push fluids to ensure adequate hydration and keep secretions thin.  Small frequent sips of fluids- Pedialyte, Gatorade, water, broth- to maintain hydration.   Zofran as needed for nausea. Tylenol and/or ibuprofen as needed for pain or fevers.   Rest.  If symptoms worsen or do not improve in the next week to return to be seen or to follow up with your PCP.     ED Prescriptions    Medication Sig Dispense Auth. Provider   ondansetron (ZOFRAN) 4 MG tablet Take 1 tablet (4 mg total) by mouth every 8 (eight) hours as needed for nausea or vomiting. 10 tablet Augusto Gamble B, NP   ibuprofen (ADVIL,MOTRIN) 800 MG tablet Take 1 tablet (800 mg total) by mouth 3 (three) times daily. 21 tablet Zigmund Gottron, NP     Controlled Substance Prescriptions [place] Controlled Substance Registry consulted? Not Applicable   Zigmund Gottron, NP 10/20/17 1653

## 2017-10-20 NOTE — ED Triage Notes (Signed)
Cough, headache, chest tightness, general body aches, and sore throat started friday

## 2017-10-20 NOTE — ED Notes (Signed)
Bed: UC01 Expected date:  Expected time:  Means of arrival:  Comments: Appointments 

## 2017-10-20 NOTE — Discharge Instructions (Signed)
Push fluids to ensure adequate hydration and keep secretions thin.  Small frequent sips of fluids- Pedialyte, Gatorade, water, broth- to maintain hydration.   Zofran as needed for nausea. Tylenol and/or ibuprofen as needed for pain or fevers.   Rest.  If symptoms worsen or do not improve in the next week to return to be seen or to follow up with your PCP.

## 2017-10-29 ENCOUNTER — Other Ambulatory Visit: Payer: Self-pay

## 2017-10-29 ENCOUNTER — Encounter (HOSPITAL_COMMUNITY): Payer: Self-pay | Admitting: Emergency Medicine

## 2017-10-29 ENCOUNTER — Ambulatory Visit (HOSPITAL_COMMUNITY)
Admission: EM | Admit: 2017-10-29 | Discharge: 2017-10-29 | Disposition: A | Discharge status: Home / Self Care | Payer: Worker's Compensation | Attending: Internal Medicine | Admitting: Internal Medicine

## 2017-10-29 DIAGNOSIS — S90211A Contusion of right great toe with damage to nail, initial encounter: Secondary | ICD-10-CM

## 2017-10-29 MED ORDER — IBUPROFEN 800 MG PO TABS
ORAL_TABLET | ORAL | Status: AC
  Filled 2017-10-29: qty 1

## 2017-10-29 MED ORDER — IBUPROFEN 800 MG PO TABS
800.0000 mg | ORAL_TABLET | Freq: Once | ORAL | Status: AC
  Administered 2017-10-29: 800 mg via ORAL

## 2017-10-29 NOTE — ED Provider Notes (Signed)
[place]    CSN: [id number] Arrival date & time: 10/29/17  1813     History   Chief Complaint Chief Complaint  Patient presents with  . Toe Pain    HPI Elizabeth Ferguson is a 32 y.o. female.   She presents today after stubbing her right great toe at work a week ago.  When she stubbed it, it split her toenail horizontally, which has caused a lot of discomfort since.  The toe itself is not swollen or bruised, and does not seem to be painful, just the area where the toenail is split distally.    HPI  Past Medical History:  Diagnosis Date  . Acne   . Eczema   . Hyperlipidemia   . Irregular menstrual cycle   . Pelvic pain in female      Past Surgical History:  Procedure Laterality Date  . CHOLECYSTECTOMY    . LAPAROSCOPY N/A 11/13/2014   Procedure: LAPAROSCOPY DIAGNOSTIC WITH PELVIC BIOPSY;  Surgeon: Ena Dawley, MD;  Location: [facility];  Service: Gynecology;  Laterality: N/A;  . LYSIS OF ADHESION N/A 11/13/2014   Procedure: LYSIS OF ADHESION;  Surgeon: Ena Dawley, MD;  Location: [facility];  Service: Gynecology;  Laterality: N/A;  . NO PAST SURGERIES       Home Medications    Prior to Admission medications   Medication Sig Start Date End Date Taking? Authorizing Provider  medroxyPROGESTERone (PROVERA) 10 MG tablet Take 10 mg by mouth daily.    [provider]    Family History Family History  Problem Relation Age of Onset  . Diabetes Mother   . Heart failure Mother   . Stroke Mother   . Diabetes Father   . Heart failure Father   . Stroke Father     Social History Social History   Tobacco Use  . Smoking status: Never Smoker  . Smokeless tobacco: Never Used  Substance Use Topics  . Alcohol use: No  . Drug use: No     Allergies   Patient has no known allergies.   Review of Systems Review of Systems  All other systems reviewed and are negative.    Physical Exam Triage Vital  Signs ED Triage Vitals  Enc Vitals Group     BP 10/29/17 1928 129/77     Pulse Rate 10/29/17 1928 79     Resp 10/29/17 1928 16     Temp --      Temp Source 10/29/17 1928 Oral     SpO2 10/29/17 1928 100 %     Weight 10/29/17 1925 245 lb (111.1 kg)     Height 10/29/17 1925 5\' 6"  (1.676 m)     Pain Score 10/29/17 1925 10     Pain Loc --    Updated Vital Signs BP 129/77 (BP Location: Left Arm)   Pulse 79   Resp 16   Ht 5\' 6"  (1.676 m)   Wt 245 lb (111.1 kg)   LMP 10/27/2017   SpO2 100%   BMI 39.54 kg/m  Physical Exam  Constitutional: She is oriented to person, place, and time. No distress.  HENT:  Head: Atraumatic.  Eyes:  Conjugate gaze observed, no eye redness/discharge  Neck: Neck supple.  Cardiovascular: Normal rate.  Pulmonary/Chest: No respiratory distress.  Abdominal: She exhibits no distension.  Musculoskeletal: Normal range of motion.  Right great toe is not swollen or bruised, nontender to palpation proximally.  Distally, the nail plate is split  horizontally but still attached minimally in the center.  The partly detached distal edges are causing some discomfort.  No subungual hematoma or bruising appreciated.  Neurological: She is alert and oriented to person, place, and time.  Skin: Skin is warm and dry.  Nursing note and vitals reviewed.    UC Treatments / Results   Procedures Nail Removal Date/Time: 10/29/2017 8:22 PM Performed by: Wynona Luna, MD Authorized by: Wynona Luna, MD   Consent:    Consent obtained:  Verbal   Consent given by:  Patient Location:    Foot:  R big toe Pre-procedure details:    Skin preparation:  Alcohol Anesthesia (see MAR for exact dosages):    Anesthesia method:  Local infiltration   Local anesthetic:  Lidocaine 2% w/o epi Nail Removal:    Nail removed:  Partial   Nail bed repaired: no     Removed nail replaced and anchored: no   Comments:     Distal third of the nail removed without difficulty,  after digital block was performed   (including critical care time)  Medications Ordered in UC Medications  ibuprofen (ADVIL,MOTRIN) tablet 800 mg (800 mg Oral Given 10/29/17 2022)    Final Clinical Impressions(s) / UC Diagnoses   Final diagnoses:  Contusion of right great toe with damage to nail, initial encounter     Discharge Instructions     Damaged right great toe nail trimmed after injection of lidocaine (numbing medicine).  Anticipate gradual improvement in pain over the next 1-2 weeks.  Recheck for increasing pain/redness/swelling/drainage or if not improving as expected.       Wynona Luna, MD 11/01/17 2207

## 2017-10-29 NOTE — ED Triage Notes (Signed)
Patient states she hit her great toe on her right foot last Wednesday splitting her toe nail.

## 2017-10-29 NOTE — Discharge Instructions (Signed)
Damaged right great toe nail trimmed after injection of lidocaine (numbing medicine).  Anticipate gradual improvement in pain over the next 1-2 weeks.  Recheck for increasing pain/redness/swelling/drainage or if not improving as expected.

## 2018-04-28 ENCOUNTER — Ambulatory Visit (HOSPITAL_COMMUNITY)
Admission: EM | Admit: 2018-04-28 | Discharge: 2018-04-28 | Disposition: A | Discharge status: Home / Self Care | Payer: BLUE CROSS/BLUE SHIELD | Attending: Internal Medicine | Admitting: Internal Medicine

## 2018-04-28 ENCOUNTER — Encounter (HOSPITAL_COMMUNITY): Payer: Self-pay

## 2018-04-28 ENCOUNTER — Other Ambulatory Visit: Payer: Self-pay

## 2018-04-28 DIAGNOSIS — B349 Viral infection, unspecified: Secondary | ICD-10-CM | POA: Diagnosis not present

## 2018-04-28 MED ORDER — BENZONATATE 200 MG PO CAPS: 200.0000 mg | ORAL_CAPSULE | Freq: Three times a day (TID) | ORAL | 0 refills | Status: AC | PRN

## 2018-04-28 MED ORDER — ONDANSETRON HCL 4 MG PO TABS: 4.0000 mg | ORAL_TABLET | Freq: Three times a day (TID) | ORAL | 0 refills | Status: DC | PRN

## 2018-04-28 NOTE — ED Triage Notes (Signed)
Pt cc chills , fever, body aches, headaches and nausea, diarrhea.x 4 days

## 2018-04-28 NOTE — ED Provider Notes (Signed)
[place]    CSN: [id number] Arrival date & time: 04/28/18  1204     History   Chief Complaint Chief Complaint  Patient presents with  . Influenza    HPI Elizabeth Ferguson is a 33 y.o. female who is an employee of a preschool daycare facility comes to the urgent care on account of 4-day history of cough, diarrhea, chills, low-grade fever and generalized body aches as well as a sore throat.  Cough is persistent and is getting progressively worse.  It was preceded by runny nose and followed by a cough.  No relieving factors.  No aggravating factors.  Cough is nonproductive.  It is associated with diarrhea (nonbloody), low-grade subjective fever, generalized body aches and headaches.  Patient has not tried any medications prior to coming to the urgent care.  HPI  Past Medical History:  Diagnosis Date  . Acne   . Eczema   . Hyperlipidemia   . Irregular menstrual cycle   . Pelvic pain in female     There are no active problems to display for this patient.   Past Surgical History:  Procedure Laterality Date  . CHOLECYSTECTOMY    . LAPAROSCOPY N/A 11/13/2014   Procedure: LAPAROSCOPY DIAGNOSTIC WITH PELVIC BIOPSY;  Surgeon: Ena Dawley, MD;  Location: [facility];  Service: Gynecology;  Laterality: N/A;  . LYSIS OF ADHESION N/A 11/13/2014   Procedure: LYSIS OF ADHESION;  Surgeon: Ena Dawley, MD;  Location: [facility];  Service: Gynecology;  Laterality: N/A;  . NO PAST SURGERIES      OB History   No obstetric history on file.      Home Medications    Prior to Admission medications   Medication Sig Start Date End Date Taking? Authorizing Provider  medroxyPROGESTERone (PROVERA) 10 MG tablet Take 10 mg by mouth daily.    [provider]    Family History Family History  Problem Relation Age of Onset  . Diabetes Mother   . Heart failure Mother   . Stroke Mother   . Diabetes Father   . Heart failure Father   .  Stroke Father     Social History Social History   Tobacco Use  . Smoking status: Never Smoker  . Smokeless tobacco: Never Used  Substance Use Topics  . Alcohol use: No  . Drug use: No     Allergies   Patient has no known allergies.   Review of Systems Review of Systems  Constitutional: Positive for chills and fatigue.  HENT: Positive for congestion. Negative for ear discharge, ear pain, mouth sores, postnasal drip and sinus pain.   Eyes: Positive for pain and itching. Negative for photophobia.  Respiratory: Positive for cough and shortness of breath. Negative for chest tightness and stridor.   Cardiovascular: Negative for chest pain and palpitations.  Gastrointestinal: Positive for diarrhea and nausea. Negative for abdominal pain.  Musculoskeletal: Positive for arthralgias and myalgias. Negative for gait problem and joint swelling.  Skin: Negative for pallor and rash.  Neurological: Positive for headaches. Negative for dizziness and numbness.     Physical Exam Triage Vital Signs ED Triage Vitals  Enc Vitals Group     BP 04/28/18 1312 (!) 146/95     Pulse Rate 04/28/18 1312 94     Resp 04/28/18 1312 18     Temp 04/28/18 1312 99.5 F (37.5 C)     Temp Source 04/28/18 1312 Tympanic     SpO2 04/28/18 1312 100 %  Weight 04/28/18 1314 230 lb (104.3 kg)     Height --      Head Circumference --      Peak Flow --      Pain Score 04/28/18 1313 8     Pain Loc --      Pain Edu? --      Excl. in [place]? --    No data found.  Updated Vital Signs BP (!) 146/95 (BP Location: Right Arm)   Pulse 94   Temp 99.5 F (37.5 C) (Tympanic)   Resp 18   Wt 104.3 kg   LMP 04/14/2018   SpO2 100%   BMI 37.12 kg/m   Visual Acuity Right Eye Distance:   Left Eye Distance:   Bilateral Distance:    Right Eye Near:   Left Eye Near:    Bilateral Near:     Physical Exam Constitutional:      Appearance: She is ill-appearing. She is not toxic-appearing.  HENT:     Head:  Normocephalic.     Right Ear: Tympanic membrane normal.     Left Ear: Tympanic membrane normal.     Nose: Nose normal.     Mouth/Throat:     Mouth: Mucous membranes are moist.     Pharynx: No posterior oropharyngeal erythema.  Eyes:     Conjunctiva/sclera: Conjunctivae normal.     Pupils: Pupils are equal, round, and reactive to light.  Neck:     Musculoskeletal: Normal range of motion. No neck rigidity.  Cardiovascular:     Rate and Rhythm: Regular rhythm. Tachycardia present.     Heart sounds: Normal heart sounds. No murmur. No friction rub. No gallop.   Pulmonary:     Effort: Pulmonary effort is normal.     Breath sounds: No wheezing, rhonchi or rales.  Abdominal:     General: Bowel sounds are normal. There is no distension.     Palpations: There is no mass.     Tenderness: There is no guarding or rebound.  Musculoskeletal: Normal range of motion.  Skin:    General: Skin is warm.     Capillary Refill: Capillary refill takes less than 2 seconds.     Findings: No erythema or rash.  Neurological:     General: No focal deficit present.      UC Treatments / Results  Labs (all labs ordered are listed, but only abnormal results are displayed) Labs Reviewed - No data to display  EKG None  Radiology No results found.  Procedures Procedures (including critical care time)  Medications Ordered in UC Medications - No data to display  Initial Impression / Assessment and Plan / UC Course  I have reviewed the triage vital signs and the nursing notes.  Pertinent labs & imaging results that were available during my care of the patient were reviewed by me and considered in my medical decision making (see chart for details).     Acute viral syndrome: Supportive care -Increase fluid intake -Zofran as needed - No need for antibiotics at this time  Final Clinical Impressions(s) / UC Diagnoses   Final diagnoses:  None   Discharge Instructions   None    ED  Prescriptions    None     Controlled Substance Prescriptions [place] Controlled Substance Registry consulted? No   Chase Picket, MD 04/28/18 2220

## 2018-12-22 ENCOUNTER — Other Ambulatory Visit: Payer: Self-pay

## 2018-12-22 ENCOUNTER — Ambulatory Visit (HOSPITAL_COMMUNITY)
Admission: EM | Admit: 2018-12-22 | Discharge: 2018-12-22 | Disposition: A | Discharge status: Home / Self Care | Payer: 59 | Attending: Urgent Care | Admitting: Urgent Care

## 2018-12-22 ENCOUNTER — Encounter (HOSPITAL_COMMUNITY): Payer: Self-pay

## 2018-12-22 DIAGNOSIS — S91312A Laceration without foreign body, left foot, initial encounter: Secondary | ICD-10-CM

## 2018-12-22 DIAGNOSIS — W25XXXA Contact with sharp glass, initial encounter: Secondary | ICD-10-CM | POA: Diagnosis not present

## 2018-12-22 DIAGNOSIS — M79672 Pain in left foot: Secondary | ICD-10-CM | POA: Diagnosis not present

## 2018-12-22 DIAGNOSIS — Z23 Encounter for immunization: Secondary | ICD-10-CM | POA: Diagnosis not present

## 2018-12-22 MED ORDER — TETANUS-DIPHTH-ACELL PERTUSSIS 5-2.5-18.5 LF-MCG/0.5 IM SUSP
INTRAMUSCULAR | Status: AC
  Filled 2018-12-22: qty 0.5

## 2018-12-22 MED ORDER — TETANUS-DIPHTH-ACELL PERTUSSIS 5-2.5-18.5 LF-MCG/0.5 IM SUSP
0.5000 mL | Freq: Once | INTRAMUSCULAR | Status: AC
  Administered 2018-12-22: 0.5 mL via INTRAMUSCULAR

## 2018-12-22 MED ORDER — LIDOCAINE-EPINEPHRINE (PF) 2 %-1:200000 IJ SOLN
INTRAMUSCULAR | Status: AC
  Filled 2018-12-22: qty 20

## 2018-12-22 NOTE — Discharge Instructions (Signed)

## 2018-12-22 NOTE — ED Triage Notes (Signed)
Pt states she stepped on some glass last night. This happened around 7 30 pm. ( left foot )

## 2018-12-22 NOTE — ED Provider Notes (Signed)
MRN: [id number] DOB: 1985-09-26  Subjective:   Elizabeth Ferguson is a 33 y.o. female presenting for 1 day history of laceration, suffered about 18 to 20 hours ago.  Patient states that she accidentally stepped on glass.  She is kept her wound clean and covered.  She came today because it still feels like it is open, has sharp pain especially with bearing any kind of weight.  Last Tdap updated in 2010 per patient.  No current facility-administered medications for this encounter.   Current Outpatient Medications:  .  medroxyPROGESTERone (PROVERA) 10 MG tablet, Take 10 mg by mouth daily., Disp: , Rfl:  .  ondansetron (ZOFRAN) 4 MG tablet, Take 1 tablet (4 mg total) by mouth every 8 (eight) hours as needed for nausea or vomiting., Disp: 4 tablet, Rfl: 0    No Known Allergies   Past Medical History:  Diagnosis Date  . Acne   . Eczema   . Hyperlipidemia   . Irregular menstrual cycle   . Pelvic pain in female      Past Surgical History:  Procedure Laterality Date  . CHOLECYSTECTOMY    . LAPAROSCOPY N/A 11/13/2014   Procedure: LAPAROSCOPY DIAGNOSTIC WITH PELVIC BIOPSY;  Surgeon: Ena Dawley, MD;  Location: [facility];  Service: Gynecology;  Laterality: N/A;  . LYSIS OF ADHESION N/A 11/13/2014   Procedure: LYSIS OF ADHESION;  Surgeon: Ena Dawley, MD;  Location: [facility];  Service: Gynecology;  Laterality: N/A;  . NO PAST SURGERIES      ROS  Objective:   Vitals: BP (!) 154/111 (BP Location: Right Arm)   Pulse 100   Temp 98.5 F (36.9 C) (Oral)   Resp 18   Wt 260 lb (117.9 kg)   LMP 12/15/2018   SpO2 100%   BMI 41.97 kg/m   Physical Exam Constitutional:      General: She is not in acute distress.    Appearance: Normal appearance. She is well-developed. She is not ill-appearing.  HENT:     Head: Normocephalic and atraumatic.     Nose: Nose normal.     Mouth/Throat:     Mouth: Mucous membranes are moist.     Pharynx: Oropharynx is  clear.  Eyes:     General: No scleral icterus.    Extraocular Movements: Extraocular movements intact.     Pupils: Pupils are equal, round, and reactive to light.  Cardiovascular:     Rate and Rhythm: Normal rate.  Pulmonary:     Effort: Pulmonary effort is normal.  Musculoskeletal:     Left foot: Normal range of motion and normal capillary refill. Tenderness (About her wound) and laceration present. No bony tenderness, swelling, crepitus or deformity.       Feet:  Skin:    General: Skin is warm and dry.  Neurological:     General: No focal deficit present.     Mental Status: She is alert and oriented to person, place, and time.  Psychiatric:        Mood and Affect: Mood normal.        Behavior: Behavior normal.     PROCEDURE NOTE: laceration repair Verbal consent obtained from patient.  Local anesthesia with 2cc Lidocaine 2% with epinephrine.  Wound explored for tendon, ligament damage. Wound scrubbed with soap and water and rinsed. Wound closed loosely with #1 4-0 Ethilon (horizontal mattress) sutures.  Wound cleansed and dressed.   Assessment and Plan :   1. Left foot pain  2. Laceration of left foot, initial encounter   3. Need for diphtheria-tetanus-pertussis (Tdap) vaccine     Laceration repaired successfully, loosely approximated given timeframe of her injury. Wound care reviewed. Return-to-clinic precautions discussed, patient verbalized understanding. Otherwise, follow up in 7 to 10 days for suture removal.     Jaynee Eagles, PA-C 12/22/18 1822

## 2018-12-29 ENCOUNTER — Encounter (HOSPITAL_COMMUNITY): Payer: Self-pay | Admitting: Emergency Medicine

## 2018-12-29 ENCOUNTER — Ambulatory Visit (HOSPITAL_COMMUNITY)
Admission: EM | Admit: 2018-12-29 | Discharge: 2018-12-29 | Disposition: A | Discharge status: Home / Self Care | Payer: 59

## 2018-12-29 ENCOUNTER — Other Ambulatory Visit: Payer: Self-pay

## 2018-12-29 NOTE — ED Triage Notes (Signed)
Pt here for suture removal to the bottom of her left foot; 1 suture removed and wound approximated

## 2019-06-02 ENCOUNTER — Ambulatory Visit (HOSPITAL_COMMUNITY)
Admission: EM | Admit: 2019-06-02 | Discharge: 2019-06-02 | Disposition: A | Discharge status: Home / Self Care | Payer: 59

## 2019-06-02 ENCOUNTER — Encounter (HOSPITAL_COMMUNITY): Payer: Self-pay

## 2019-06-02 ENCOUNTER — Other Ambulatory Visit: Payer: Self-pay

## 2019-06-02 DIAGNOSIS — R110 Nausea: Secondary | ICD-10-CM | POA: Diagnosis not present

## 2019-06-02 DIAGNOSIS — K219 Gastro-esophageal reflux disease without esophagitis: Secondary | ICD-10-CM

## 2019-06-02 MED ORDER — ONDANSETRON HCL 4 MG PO TABS: 4.0000 mg | ORAL_TABLET | Freq: Three times a day (TID) | ORAL | 0 refills | Status: DC | PRN

## 2019-06-02 MED ORDER — FAMOTIDINE 20 MG PO TABS: 20.0000 mg | ORAL_TABLET | Freq: Two times a day (BID) | ORAL | 0 refills | Status: DC

## 2019-06-02 NOTE — ED Provider Notes (Signed)
[place]    CSN: [id number] Arrival date & time: 06/02/19  1739      History   Chief Complaint Chief Complaint  Patient presents with  . Nausea    HPI Elizabeth Ferguson is a 34 y.o. female.   Patient reports to urgent care for complaint of nausea for 3 days.  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  She has had relatively constant nausea since that time but this is mainly worse following food.  She denies abdominal pain and vomiting.  She does report a history of waking up in the mornings with nausea and this generally subsides after eating a small amount of food.  Her current nausea at this point has made her avoid foods.  She denies fever, chills, cough, congestion, diarrhea.  She denies eating large meals right before bed.  She denies having a history of GERD.  She reports a history of cholecystectomy and procedures to remove endometriosis.  Patient denies any chance of being pregnant as she is completely abstinent.     Past Medical History:  Diagnosis Date  . Acne   . Eczema   . Hyperlipidemia   . Irregular menstrual cycle   . Pelvic pain in female     There are no problems to display for this patient.   Past Surgical History:  Procedure Laterality Date  . CHOLECYSTECTOMY    . LAPAROSCOPY N/A 11/13/2014   Procedure: LAPAROSCOPY DIAGNOSTIC WITH PELVIC BIOPSY;  Surgeon: Ena Dawley, MD;  Location: [facility];  Service: Gynecology;  Laterality: N/A;  . LYSIS OF ADHESION N/A 11/13/2014   Procedure: LYSIS OF ADHESION;  Surgeon: Ena Dawley, MD;  Location: [facility];  Service: Gynecology;  Laterality: N/A;  . NO PAST SURGERIES      OB History   No obstetric history on file.      Home Medications    Prior to Admission medications   Medication Sig Start Date End Date Taking? Authorizing Provider  HYDROcodone-acetaminophen (NORCO/VICODIN) 5-325 MG  tablet Take by mouth. 08/23/15  Yes [provider]  ibuprofen (ADVIL) 100 MG chewable tablet Chew by mouth. 11/13/14  Yes [provider]  medroxyPROGESTERone (PROVERA) 10 MG tablet TK 2 TS PO QD 08/02/15  Yes [provider]  Norethindrone Acetate-Ethinyl Estrad-FE (LOESTRIN 24 FE) 1-20 MG-MCG(24) tablet TK 1 T PO QD 07/18/15  Yes [provider]  famotidine (PEPCID) 20 MG tablet Take 1 tablet (20 mg total) by mouth 2 (two) times daily. 06/02/19   Moe Brier, Marguerita Beards, PA-C  medroxyPROGESTERone (PROVERA) 10 MG tablet Take 10 mg by mouth daily.    [provider]  ondansetron (ZOFRAN) 4 MG tablet Take 1 tablet (4 mg total) by mouth every 8 (eight) hours as needed for nausea or vomiting. 06/02/19   Shigeru Lampert, Marguerita Beards, PA-C  UNABLE TO FIND Med Name: [facility] FE, taken once daily for endometriosis    [provider]    Family History Family History  Problem Relation Age of Onset  . Diabetes Mother   . Heart failure Mother   . Stroke Mother   . Diabetes Father   . Heart failure Father   . Stroke Father     Social History Social History   Tobacco Use  . Smoking status: Never Smoker  . Smokeless tobacco: Never Used  Substance Use Topics  . Alcohol use: No  . Drug use: No  Allergies   Patient has no known allergies.   Review of Systems Review of Systems  Constitutional: Negative for chills and fever.  HENT: Negative for congestion, ear pain, sinus pressure, sinus pain and sore throat.   Eyes: Negative for pain and visual disturbance.  Respiratory: Negative for cough and shortness of breath.   Cardiovascular: Negative for chest pain and palpitations.  Gastrointestinal: Positive for nausea. Negative for abdominal pain, constipation, diarrhea and vomiting.  Genitourinary: Negative for dysuria and hematuria.  Musculoskeletal: Negative for arthralgias, back pain and myalgias.  Skin: Negative for color change and rash.  Neurological:  Negative for seizures, syncope and headaches.  All other systems reviewed and are negative.    Physical Exam Triage Vital Signs ED Triage Vitals  Enc Vitals Group     BP 06/02/19 1759 (!) 147/67     Pulse Rate 06/02/19 1759 99     Resp 06/02/19 1759 19     Temp 06/02/19 1759 98 F (36.7 C)     Temp Source 06/02/19 1759 Oral     SpO2 06/02/19 1759 96 %     Weight 06/02/19 1756 230 lb 3.2 oz (104.4 kg)     Height --      Head Circumference --      Peak Flow --      Pain Score 06/02/19 1756 0     Pain Loc --      Pain Edu? --      Excl. in [place]? --    No data found.  Updated Vital Signs BP (!) 147/67 (BP Location: Left Arm)   Pulse 99   Temp 98 F (36.7 C) (Oral)   Resp 19   Wt 230 lb 3.2 oz (104.4 kg)   LMP 05/04/2019   SpO2 96%   BMI 37.16 kg/m   Visual Acuity Right Eye Distance:   Left Eye Distance:   Bilateral Distance:    Right Eye Near:   Left Eye Near:    Bilateral Near:     Physical Exam Vitals and nursing note reviewed.  Constitutional:      General: She is not in acute distress.    Appearance: Normal appearance. She is well-developed. She is not ill-appearing.  HENT:     Head: Normocephalic and atraumatic.  Eyes:     Conjunctiva/sclera: Conjunctivae normal.  Cardiovascular:     Rate and Rhythm: Normal rate.  Pulmonary:     Effort: Pulmonary effort is normal. No respiratory distress.  Abdominal:     General: Bowel sounds are normal.     Palpations: Abdomen is soft.     Tenderness: There is no abdominal tenderness. There is no right CVA tenderness or left CVA tenderness.  Musculoskeletal:        General: Normal range of motion.     Cervical back: Neck supple.     Right lower leg: No edema.     Left lower leg: No edema.  Skin:    General: Skin is warm and dry.     Findings: No rash.  Neurological:     General: No focal deficit present.     Mental Status: She is alert and oriented to person, place, and time.  Psychiatric:        Mood and  Affect: Mood normal.        Behavior: Behavior normal.        Thought Content: Thought content normal.        Judgment: Judgment normal.  UC Treatments / Results  Labs (all labs ordered are listed, but only abnormal results are displayed) Labs Reviewed - No data to display  EKG   Radiology No results found.  Procedures Procedures (including critical care time)  Medications Ordered in UC Medications - No data to display  Initial Impression / Assessment and Plan / UC Course  I have reviewed the triage vital signs and the nursing notes.  Pertinent labs & imaging results that were available during my care of the patient were reviewed by me and considered in my medical decision making (see chart for details).     #Nausea #GERD Patient is a 34 year old female with no chronic medical history presenting with 3 days of nausea.  I believe there is an aspect of GERD given that she wakes up with nausea frequently.  We will treat symptomatically today for her nauseous feeling and send out on famotidine for as needed acid reduction with instructions to switch to Prilosec if not having improvement in a.m. nauseous feeling on famotidine. -Zofran as needed every 8 -Instructed to follow-up with primary care if not improving in 3 to 4 days   Final Clinical Impressions(s) / UC Diagnoses   Final diagnoses:  Nausea  Gastroesophageal reflux disease without esophagitis     Discharge Instructions     I want you to try the Pepcid twice a day and ensure 1 of those doses is right before bed.  Take the Zofran up to every 8 hours for nausea.  Eat small meals.  If you are continuing to have nausea-like feelings in the morning first thing while on Pepcid I would like for you to start Prilosec once a day for 2 weeks.  If you are continue to not have improvement in your symptoms please follow-up with your primary care.      ED Prescriptions    Medication Sig Dispense Auth. Provider    ondansetron (ZOFRAN) 4 MG tablet Take 1 tablet (4 mg total) by mouth every 8 (eight) hours as needed for nausea or vomiting. 6 tablet Emmaus Brandi, Marguerita Beards, PA-C   famotidine (PEPCID) 20 MG tablet Take 1 tablet (20 mg total) by mouth 2 (two) times daily. 30 tablet Nishant Schrecengost, Marguerita Beards, PA-C     PDMP not reviewed this encounter.   Purnell Shoemaker, PA-C 06/02/19 1824

## 2019-06-02 NOTE — Discharge Instructions (Addendum)
I want you to try the Pepcid twice a day and ensure 1 of those doses is right before bed.  Take the Zofran up to every 8 hours for nausea.  Eat small meals.  If you are continuing to have nausea-like feelings in the morning first thing while on Pepcid I would like for you to start Prilosec once a day for 2 weeks.  If you are continue to not have improvement in your symptoms please follow-up with your primary care.

## 2019-06-02 NOTE — ED Triage Notes (Signed)
Pt is here with nausea that started Tuesday, she has not taken any meds to relieve discomfort.

## 2019-08-07 ENCOUNTER — Ambulatory Visit: Payer: Self-pay | Admitting: Allergy and Immunology

## 2019-09-11 ENCOUNTER — Other Ambulatory Visit: Payer: Self-pay

## 2019-09-11 ENCOUNTER — Ambulatory Visit (HOSPITAL_COMMUNITY)
Admission: EM | Admit: 2019-09-11 | Discharge: 2019-09-11 | Disposition: A | Discharge status: Home / Self Care | Payer: HRSA Program | Attending: Family Medicine | Admitting: Family Medicine

## 2019-09-11 DIAGNOSIS — Z20822 Contact with and (suspected) exposure to covid-19: Secondary | ICD-10-CM | POA: Diagnosis present

## 2019-09-11 DIAGNOSIS — B349 Viral infection, unspecified: Secondary | ICD-10-CM | POA: Diagnosis present

## 2019-09-11 MED ORDER — IBUPROFEN 800 MG PO TABS: 800.0000 mg | ORAL_TABLET | Freq: Three times a day (TID) | ORAL | 0 refills | Status: DC

## 2019-09-11 NOTE — Discharge Instructions (Signed)
Go home to rest Drink plenty of fluids Take Tylenol for pain or fever Take ibuprofen 3 x a day with food for body aches You may take over-the-counter cough and cold medicines as needed You must quarantine at home until your test result is available You can check for your test result in MyChart

## 2019-09-11 NOTE — ED Triage Notes (Signed)
Pt c/o body aches, chills, stomach cramping, cough and sore throat since Friday. Pt taking OTC dayquil/nyquil without relief. No known sick contacts.

## 2019-09-11 NOTE — ED Provider Notes (Signed)
[facility]    CSN: [id number] Arrival date & time: 09/11/19  1616      History   Chief Complaint Chief Complaint  Patient presents with  . Generalized Body Aches    HPI Elizabeth Ferguson is a 34 y.o. female.   HPI  Patient is here for body aches.  She states she has had some chills but no sweats.  Cramping and cough.  No short of breath.  No loss of taste or smell.  No nasal congestion, runny stuffy nose, headache, or sore throat.  She states her appetite is poor.  She is trying to drink fluids but has not been drinking as much as usual.  No nausea, vomiting, diarrhea.  She has been taking DayQuil and NyQuil with no relief.  Body aches are her prominent symptom.  Past Medical History:  Diagnosis Date  . Acne   . Eczema   . Hyperlipidemia   . Irregular menstrual cycle   . Pelvic pain in female     There are no problems to display for this patient.   Past Surgical History:  Procedure Laterality Date  . CHOLECYSTECTOMY    . LAPAROSCOPY N/A 11/13/2014   Procedure: LAPAROSCOPY DIAGNOSTIC WITH PELVIC BIOPSY;  Surgeon: Ena Dawley, MD;  Location: [facility];  Service: Gynecology;  Laterality: N/A;  . LYSIS OF ADHESION N/A 11/13/2014   Procedure: LYSIS OF ADHESION;  Surgeon: Ena Dawley, MD;  Location: [facility];  Service: Gynecology;  Laterality: N/A;  . NO PAST SURGERIES      OB History   No obstetric history on file.      Home Medications    Prior to Admission medications   Medication Sig Start Date End Date Taking? Authorizing Provider  HYDROcodone-acetaminophen (NORCO/VICODIN) 5-325 MG tablet Take by mouth. 08/23/15   [provider]  ibuprofen (ADVIL) 800 MG tablet Take 1 tablet (800 mg total) by mouth 3 (three) times daily. 09/11/19   Raylene Everts, MD    Family History Family History  Problem Relation Age of Onset  . Diabetes Mother   . Heart failure Mother   . Stroke Mother   . Diabetes Father    . Heart failure Father   . Stroke Father     Social History Social History   Tobacco Use  . Smoking status: Never Smoker  . Smokeless tobacco: Never Used  Substance Use Topics  . Alcohol use: No  . Drug use: No     Allergies   Patient has no known allergies.   Review of Systems Review of Systems  Constitutional: Positive for appetite change and fatigue.  Respiratory: Positive for cough.   Musculoskeletal: Positive for myalgias.     Physical Exam Triage Vital Signs ED Triage Vitals  Enc Vitals Group     BP 09/11/19 1703 (!) 145/98     Pulse Rate 09/11/19 1703 90     Resp 09/11/19 1703 16     Temp 09/11/19 1703 97.8 F (36.6 C)     Temp src --      SpO2 09/11/19 1703 100 %     Weight --      Height --      Head Circumference --      Peak Flow --      Pain Score 09/11/19 1704 7     Pain Loc --      Pain Edu? --      Excl. in [place]? --  No data found.  Updated Vital Signs BP (!) 145/98   Pulse 90   Temp 97.8 F (36.6 C)   Resp 16   LMP 08/18/2019   SpO2 100%    Physical Exam Constitutional:      General: She is not in acute distress.    Appearance: She is well-developed. She is obese. She is ill-appearing.  HENT:     Head: Normocephalic and atraumatic.     Nose: No congestion.     Comments: Mask is in place.  Oropharynx is benign Eyes:     Conjunctiva/sclera: Conjunctivae normal.     Pupils: Pupils are equal, round, and reactive to light.  Cardiovascular:     Rate and Rhythm: Normal rate and regular rhythm.     Heart sounds: Normal heart sounds.  Pulmonary:     Effort: Pulmonary effort is normal. No respiratory distress.     Breath sounds: Normal breath sounds.     Comments: Lungs are clear Abdominal:     General: There is no distension.     Palpations: Abdomen is soft.  Musculoskeletal:        General: Normal range of motion.     Cervical back: Normal range of motion and neck supple.  Lymphadenopathy:     Cervical: No cervical  adenopathy.  Skin:    General: Skin is warm and dry.  Neurological:     Mental Status: She is alert.  Psychiatric:        Mood and Affect: Mood normal.        Behavior: Behavior normal.      UC Treatments / Results  Labs (all labs ordered are listed, but only abnormal results are displayed) Labs Reviewed  SARS CORONAVIRUS 2 (TAT 6-24 HRS)    EKG   Radiology No results found.  Procedures Procedures (including critical care time)  Medications Ordered in UC Medications - No data to display  Initial Impression / Assessment and Plan / UC Course  I have reviewed the triage vital signs and the nursing notes.  Pertinent labs & imaging results that were available during my care of the patient were reviewed by me and considered in my medical decision making (see chart for details).     *Reviewed possibility of viral illness versus Covid.  We talked about the importance of quarantine until Covid test is available.  Patient works as a Clinical research associate.  No known direct exposure to Covid although it is possible.  She has not had her vaccinations. Final Clinical Impressions(s) / UC Diagnoses   Final diagnoses:  Viral syndrome  Suspected COVID-19 virus infection     Discharge Instructions     Go home to rest Drink plenty of fluids Take Tylenol for pain or fever Take ibuprofen 3 x a day with food for body aches You may take over-the-counter cough and cold medicines as needed You must quarantine at home until your test result is available You can check for your test result in MyChart    ED Prescriptions    Medication Sig Dispense Auth. Provider   ibuprofen (ADVIL) 800 MG tablet Take 1 tablet (800 mg total) by mouth 3 (three) times daily. 21 tablet Raylene Everts, MD     PDMP not reviewed this encounter.   Raylene Everts, MD 09/11/19 Johnnye Lana

## 2019-09-12 LAB — SARS CORONAVIRUS 2 (TAT 6-24 HRS): SARS Coronavirus 2: NEGATIVE

## 2020-03-08 ENCOUNTER — Emergency Department (HOSPITAL_COMMUNITY)
Admission: EM | Admit: 2020-03-08 | Discharge: 2020-03-09 | Disposition: A | Discharge status: Home / Self Care | Payer: Self-pay | Attending: Emergency Medicine | Admitting: Emergency Medicine

## 2020-03-08 ENCOUNTER — Other Ambulatory Visit: Payer: Self-pay

## 2020-03-08 ENCOUNTER — Ambulatory Visit (HOSPITAL_COMMUNITY)
Admission: EM | Admit: 2020-03-08 | Discharge: 2020-03-08 | Disposition: A | Discharge status: Home / Self Care | Payer: Self-pay

## 2020-03-08 ENCOUNTER — Encounter (HOSPITAL_COMMUNITY): Payer: Self-pay | Admitting: Emergency Medicine

## 2020-03-08 DIAGNOSIS — R42 Dizziness and giddiness: Secondary | ICD-10-CM | POA: Insufficient documentation

## 2020-03-08 DIAGNOSIS — Z7982 Long term (current) use of aspirin: Secondary | ICD-10-CM | POA: Insufficient documentation

## 2020-03-08 DIAGNOSIS — R519 Headache, unspecified: Secondary | ICD-10-CM

## 2020-03-08 DIAGNOSIS — Z20822 Contact with and (suspected) exposure to covid-19: Secondary | ICD-10-CM | POA: Insufficient documentation

## 2020-03-08 NOTE — ED Triage Notes (Signed)
Pt sent here by UC for further eval of severe right sided headache onset this morning around 0600, waking her from sleep. Took some Goody powder around 0700 without relief.

## 2020-03-08 NOTE — ED Triage Notes (Addendum)
Pt presents to [facility] for assessment of right sided headache starting this morning that woke her up out of sleep.  States she is generally weak, but denies specific laterality or appendage.  Patient c/o blurred vision, sensitivity lights, dark specs in her vision.  Patient states she was seen in the ER on 11/16 for syncope and headache, but it was not on-sided at the time.  Has not been able to schedule follow-up yet.

## 2020-03-08 NOTE — Discharge Instructions (Signed)
Please go to Emergency Department for further care and management.

## 2020-03-08 NOTE — ED Provider Notes (Signed)
____________________________________________  Time seen: Approximately 3:00 PM  I have reviewed the triage vital signs and the nursing notes.   HISTORY  Chief Complaint Headache   Historian Patient    HPI Elizabeth Ferguson is a 34 y.o. female presents to the urgent care with a new, atypical headache.  Patient states that she awoke with a sharp, stabbing right-sided headache with blurry vision.  Patient states that she went to work today despite headache and had a near syncopal episode while at work.  She states that 2 weeks ago, she had a syncopal episode at work.  She sought care at emergency department on 02/23/2020 and had CT of her head and C-spine which were reassuring at the time.  Patient states that she did not have headache prior to or after syncope 2 weeks ago.  Patient states that she is having weakness in her arms and legs. She did not feel well enough to drive to the urgent care.  No chest pain, chest tightness or abdominal pain. No other alleviating measures have been attempted.    Past Medical History:  Diagnosis Date  . Acne   . Eczema   . Hyperlipidemia   . Irregular menstrual cycle   . Pelvic pain in female      Immunizations up to date:  Yes.     Past Medical History:  Diagnosis Date  . Acne   . Eczema   . Hyperlipidemia   . Irregular menstrual cycle   . Pelvic pain in female     There are no problems to display for this patient.   Past Surgical History:  Procedure Laterality Date  . CHOLECYSTECTOMY    . LAPAROSCOPY N/A 11/13/2014   Procedure: LAPAROSCOPY DIAGNOSTIC WITH PELVIC BIOPSY;  Surgeon: Ena Dawley, MD;  Location: [facility];  Service: Gynecology;  Laterality: N/A;  . LYSIS OF ADHESION N/A 11/13/2014   Procedure: LYSIS OF ADHESION;  Surgeon: Ena Dawley, MD;  Location: [facility];  Service: Gynecology;  Laterality: N/A;  . NO PAST SURGERIES      Prior to Admission medications   Medication Sig  Start Date End Date Taking? Authorizing Provider  HYDROcodone-acetaminophen (NORCO/VICODIN) 5-325 MG tablet Take by mouth. 08/23/15   [provider]  ibuprofen (ADVIL) 800 MG tablet Take 1 tablet (800 mg total) by mouth 3 (three) times daily. 09/11/19   Raylene Everts, MD    Allergies Patient has no known allergies.  Family History  Problem Relation Age of Onset  . Diabetes Mother   . Heart failure Mother   . Stroke Mother   . Diabetes Father   . Heart failure Father   . Stroke Father     Social History Social History   Tobacco Use  . Smoking status: Never Smoker  . Smokeless tobacco: Never Used  Substance Use Topics  . Alcohol use: No  . Drug use: No     Review of Systems  Constitutional: No fever/chills Eyes:  No discharge ENT: No upper respiratory complaints. Respiratory: no cough. No SOB/ use of accessory muscles to breath Gastrointestinal:   No nausea, no vomiting.  No diarrhea.  No constipation. Musculoskeletal: Negative for musculoskeletal pain. Neuro: Patient has headache.  Skin: Negative for rash, abrasions, lacerations, ecchymosis.    ____________________________________________   PHYSICAL EXAM:  VITAL SIGNS: ED Triage Vitals  Enc Vitals Group     BP 03/08/20 1345 (!) 145/85     Pulse Rate 03/08/20 1345 99  Resp 03/08/20 1345 18     Temp 03/08/20 1345 98.7 F (37.1 C)     Temp Source 03/08/20 1345 Oral     SpO2 03/08/20 1345 100 %     Weight --      Height --      Head Circumference --      Peak Flow --      Pain Score 03/08/20 1346 9     Pain Loc --      Pain Edu? --      Excl. in [place]? --      Constitutional: Alert and oriented. Well appearing and in no acute distress. Eyes: Conjunctivae are normal. PERRL. EOMI. Head: Atraumatic. ENT:      Ears: TMs are pearly.       Nose: No congestion/rhinnorhea.      Mouth/Throat: Mucous membranes are moist.  Neck: No stridor.  No cervical spine tenderness to  palpation. Cardiovascular: Normal rate, regular rhythm. Normal S1 and S2.  Good peripheral circulation. Respiratory: Normal respiratory effort without tachypnea or retractions. Lungs CTAB. Good air entry to the bases with no decreased or absent breath sounds Gastrointestinal: Bowel sounds x 4 quadrants. Soft and nontender to palpation. No guarding or rigidity. No distention. Musculoskeletal: Patient has symmetric strength in the arms and legs.  Full range of motion to all extremities. No obvious deformities noted Neurologic:  Normal for age. No gross focal neurologic deficits are appreciated.  Skin:  Skin is warm, dry and intact. No rash noted. Psychiatric: Mood and affect are normal for age. Speech and behavior are normal.   ____________________________________________   LABS (all labs ordered are listed, but only abnormal results are displayed)  Labs Reviewed - No data to display ____________________________________________  EKG   ____________________________________________  RADIOLOGY  No results found.  ____________________________________________    PROCEDURES  Procedure(s) performed:     Procedures     Medications - No data to display   ____________________________________________   INITIAL IMPRESSION / ASSESSMENT AND PLAN / ED COURSE  Pertinent labs & imaging results that were available during my care of the patient were reviewed by me and considered in my medical decision making (see chart for details).      Assessment and Plan:  Headache 34 year old female presents to the urgent care with a new, atypical headache that awoke her from sleep. Patient is complaining of right-sided headache with weakness in the arms and legs.  Vital signs are reassuring at triage.  On physical exam, patient seemed fatigued.  She was reclined in a supine position.   Patient was referred to the emergency department for further care and management as I do not feel  comfortable treating patient's headache without CT imaging as patient is endorsing an atypical headache with weakness. All patient questions were answered.     ____________________________________________  FINAL CLINICAL IMPRESSION(S) / ED DIAGNOSES  Final diagnoses:  Acute nonintractable headache, unspecified headache type      NEW MEDICATIONS STARTED DURING THIS VISIT:  ED Discharge Orders    None          This chart was dictated using voice recognition software/Dragon. Despite best efforts to proofread, errors can occur which can change the meaning. Any change was purely unintentional.     Lannie Fields, PA-C 03/08/20 1550

## 2020-03-09 ENCOUNTER — Emergency Department (HOSPITAL_COMMUNITY): Payer: Self-pay

## 2020-03-09 LAB — BASIC METABOLIC PANEL
Anion gap: 9 (ref 5–15)
BUN: 5 mg/dL — ABNORMAL LOW (ref 6–20)
CO2: 27 mmol/L (ref 22–32)
Calcium: 9.3 mg/dL (ref 8.9–10.3)
Chloride: 102 mmol/L (ref 98–111)
Creatinine, Ser: 0.7 mg/dL (ref 0.44–1.00)
GFR, Estimated: >60 mL/min (ref >60)
Glucose, Bld: 118 mg/dL — ABNORMAL HIGH (ref 70–99)
Potassium: 4.2 mmol/L (ref 3.5–5.1)
Sodium: 138 mmol/L (ref 135–145)

## 2020-03-09 LAB — I-STAT BETA HCG BLOOD, ED (MC, WL, AP ONLY): I-stat hCG, quantitative: <5 m[IU]/mL (ref <5)

## 2020-03-09 LAB — CBC WITH DIFFERENTIAL/PLATELET
Abs Immature Granulocytes: 0.02 10*3/uL (ref 0.00–0.07)
Basophils Absolute: 0 10*3/uL (ref 0.0–0.1)
Basophils Relative: 1 %
Eosinophils Absolute: 0 10*3/uL (ref 0.0–0.5)
Eosinophils Relative: 1 %
HCT: 38.6 % (ref 36.0–46.0)
Hemoglobin: 13 g/dL (ref 12.0–15.0)
Immature Granulocytes: 1 %
Lymphocytes Relative: 28 %
Lymphs Abs: 1.1 10*3/uL (ref 0.7–4.0)
MCH: 31 pg (ref 26.0–34.0)
MCHC: 33.7 g/dL (ref 30.0–36.0)
MCV: 91.9 fL (ref 80.0–100.0)
Monocytes Absolute: 0.5 10*3/uL (ref 0.1–1.0)
Monocytes Relative: 13 %
Neutro Abs: 2.3 10*3/uL (ref 1.7–7.7)
Neutrophils Relative %: 56 %
Platelets: 282 10*3/uL (ref 150–400)
RBC: 4.2 MIL/uL (ref 3.87–5.11)
RDW: 13.2 % (ref 11.5–15.5)
WBC: 4 10*3/uL (ref 4.0–10.5)
nRBC: 0 % (ref 0.0–0.2)

## 2020-03-09 LAB — RESP PANEL BY RT-PCR (FLU A&B, COVID) ARPGX2
Influenza A by PCR: NEGATIVE
Influenza B by PCR: NEGATIVE
SARS Coronavirus 2 by RT PCR: NEGATIVE

## 2020-03-09 LAB — TROPONIN I (HIGH SENSITIVITY): Troponin I (High Sensitivity): 3 ng/L (ref <18)

## 2020-03-09 MED ORDER — DEXAMETHASONE SODIUM PHOSPHATE 10 MG/ML IJ SOLN
8.0000 mg | Freq: Once | INTRAMUSCULAR | Status: AC
  Administered 2020-03-09: 8 mg via INTRAVENOUS
  Filled 2020-03-09: qty 1

## 2020-03-09 MED ORDER — SODIUM CHLORIDE 0.9 % IV BOLUS
1000.0000 mL | Freq: Once | INTRAVENOUS | Status: AC
  Administered 2020-03-09: 1000 mL via INTRAVENOUS

## 2020-03-09 MED ORDER — DIPHENHYDRAMINE HCL 50 MG/ML IJ SOLN
12.5000 mg | Freq: Once | INTRAMUSCULAR | Status: AC
Start: 2020-03-09 — End: 2020-03-09
  Administered 2020-03-09: 12.5 mg via INTRAVENOUS
  Filled 2020-03-09: qty 1

## 2020-03-09 MED ORDER — KETOROLAC TROMETHAMINE 30 MG/ML IJ SOLN
30.0000 mg | Freq: Once | INTRAMUSCULAR | Status: AC
  Administered 2020-03-09: 30 mg via INTRAVENOUS
  Filled 2020-03-09: qty 1

## 2020-03-09 MED ORDER — BUTALBITAL-APAP-CAFFEINE 50-325-40 MG PO TABS
1.0000 | ORAL_TABLET | Freq: Four times a day (QID) | ORAL | 0 refills | Status: DC | PRN
Start: 2020-03-09 — End: 2020-07-24

## 2020-03-09 MED ORDER — METOCLOPRAMIDE HCL 5 MG/ML IJ SOLN
10.0000 mg | Freq: Once | INTRAMUSCULAR | Status: AC
  Administered 2020-03-09: 10 mg via INTRAVENOUS
  Filled 2020-03-09: qty 2

## 2020-03-09 NOTE — ED Provider Notes (Signed)
[facility]GENC[facility] DEPARTMENT Provider Note   CSN: [id number] Arrival date & time: 03/08/20  1501    History Chief Complaint  Patient presents with  . Headache   Elizabeth Ferguson is a 34 y.o. female with history of headaches, recurrent syncope presents to ER for evaluation of headache. Sent to ER from [facility] for further evaluation.   Reports sudden onset of headache at 5 am yesterday that woke her up from her sleep. Pain was 10/10 now 8.5/10, constant, throbbing.  This headache felt the "exact same" as her usual headaches in the past.  Reports she sat up, walked to get water and to the bathroom and she developed light headedness, felt like she stumbled as she was walking.  Had associated palpitations, shortness of breath. Denies syncope.  No nausea, vomiting, diaphoresis.  She took a goody powder for headache and went to work.  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 Reports noise, lights made her feel worse. She felt disoriented/slow at work and had to leave work. Also reports a few days of productive cough, central and left sided sharp chest pain that has been constant for last few days. Chest pain described as the feeling like you have to sneeze but can't.  Fully vaccinated for COVID, no booster.  Denies other respiratory symptoms. Reports long history of headaches since age 30. She had a head injury where a motorized door "crushed" her head on both temples.  Gets headache 4-5 times a week.  Usually headaches are associated with photophobia, phonophobia, tingling in bilateral arms, hands, legs, visual disturbances described a glossy, blurred vision and black specks in her vision.  She tries to avoid taking medicines and usually resolved by sleep.  Uses ibuprofen, acetaminophen as needed for headaches as last resort. Headaches are usually right sided and involve face, scalp.  Has never been to a neurologist. No known family of migraines.  Irregular menses and does not know if  headaches are worse with menses.  Also reports long history of recurrent syncope since childhood.  Two weeks ago she had a passing out episode while at work. In the past she has passed out in front of family - was told her eyes rolled in back of her head and she collapsed. No shaking or seizing. Unsure about post-ictal states. Seen at [facility] ER recently for syncope. She had head and cervical spine CTs, serial troponins, EKG, urinalysis and lab work that were all normal. Was recommended to see a neurologist and cardiologist but has not. Has never seen either. No known history of seizures but reports maternal grandfather, aunt and cousin have seizures. Patient does not drive, uses uber.  Reports being overworked, works 10+ hours, being exhausted. She asked for a few days off to rest last week but was not approved.   Denies one sided weakness, drooping or numbness.  No fever, neck pain.  No recent head trauma.  No recent illnesses, sinus issues, congestion, sore throat. No syncope. No known cardiac problems. No history of PE, PERC negative.  Drinks 1 glass of wine once weekly. No tobacco use. No recreational drugs.    HPI     Past Medical History:  Diagnosis Date  . Acne   . Eczema   . Hyperlipidemia   . Irregular menstrual cycle   . Pelvic pain in female     There are no problems to display for this patient.   Past Surgical History:  Procedure Laterality Date  . CHOLECYSTECTOMY    .  LAPAROSCOPY N/A 11/13/2014   Procedure: LAPAROSCOPY DIAGNOSTIC WITH PELVIC BIOPSY;  Surgeon: Ena Dawley, MD;  Location: [facility];  Service: Gynecology;  Laterality: N/A;  . LYSIS OF ADHESION N/A 11/13/2014   Procedure: LYSIS OF ADHESION;  Surgeon: Ena Dawley, MD;  Location: [facility];  Service: Gynecology;  Laterality: N/A;  . NO PAST SURGERIES       OB History   No obstetric history on file.     Family History  Problem Relation Age of Onset  . Diabetes Mother   .  Heart failure Mother   . Stroke Mother   . Diabetes Father   . Heart failure Father   . Stroke Father     Social History   Tobacco Use  . Smoking status: Never Smoker  . Smokeless tobacco: Never Used  Substance Use Topics  . Alcohol use: No  . Drug use: No    Home Medications Prior to Admission medications   Medication Sig Start Date End Date Taking? Authorizing Provider  Aspirin-Salicylamide-Caffeine (BC HEADACHE POWDER PO) Take 1 Dose by mouth daily as needed (headache).   Yes [provider]  ECHINACEA EXTRACT PO Take 1 tablet by mouth daily.   Yes [provider]  vitamin B-12 (CYANOCOBALAMIN) 50 MCG tablet Take 25 mcg by mouth daily.   Yes [provider]  vitamin B-6 (PYRIDOXINE) 25 MG tablet Take 25 mg by mouth daily.   Yes [provider]  butalbital-acetaminophen-caffeine (FIORICET) 50-325-40 MG tablet Take 1-2 tablets by mouth every 6 (six) hours as needed for headache. 03/09/20 03/09/21  Kinnie Feil, PA-C  HYDROcodone-acetaminophen (NORCO/VICODIN) 5-325 MG tablet Take by mouth. Patient not taking: Reported on 03/09/2020 08/23/15   [provider]  ibuprofen (ADVIL) 800 MG tablet Take 1 tablet (800 mg total) by mouth 3 (three) times daily. Patient not taking: Reported on 03/09/2020 09/11/19   Raylene Everts, MD    Allergies    Pineapple  Review of Systems   Review of Systems  Constitutional: Positive for fatigue.  Respiratory: Positive for cough and shortness of breath.   Cardiovascular: Positive for chest pain and palpitations.  Neurological: Positive for light-headedness and headaches.  All other systems reviewed and are negative.   Physical Exam Updated Vital Signs BP 136/90 (BP Location: Right Arm)   Pulse 76   Temp 98.3 F (36.8 C) (Oral)   Resp 18   LMP 02/19/2020   SpO2 100%   Physical Exam Vitals and nursing note reviewed.  Constitutional:      General: She is not in acute distress.     Appearance: She is well-developed.     Comments: NAD.  HENT:     Head: Normocephalic and atraumatic.     Comments: No temporal tenderness     Right Ear: External ear normal.     Left Ear: External ear normal.     Nose: Nose normal.  Eyes:     Conjunctiva/sclera: Conjunctivae normal.  Cardiovascular:     Rate and Rhythm: Normal rate and regular rhythm.     Heart sounds: Normal heart sounds.     Comments: 1+ radial and DP pulses bilaterally. No LE edema or calf tenderness.  Pulmonary:     Effort: Pulmonary effort is normal.     Breath sounds: Normal breath sounds.  Musculoskeletal:        General: No deformity. Normal range of motion.     Cervical back: Normal range of motion and neck  supple.  Skin:    General: Skin is warm and dry.     Capillary Refill: Capillary refill takes less than 2 seconds.  Neurological:     Mental Status: She is alert and oriented to person, place, and time.     Comments:   Mental Status: Patient is awake, alert, oriented to person, place, year, and situation. Patient is able to give a clear and coherent history.  Speech is fluent and clear without dysarthria or aphasia.  No signs of neglect.  Cranial Nerves: I not tested II visual fields full bilaterally. PERRL.   III, IV, VI EOMs intact without ptosis V sensation to light touch intact in all 3 divisions of trigeminal nerve bilaterally  VII facial movements symmetric bilaterally VIII hearing intact to voice/conversation  IX, X no uvula deviation, symmetric rise of soft palate/uvula XI 5/5 SCM and trapezius strength bilaterally  XII tongue protrusion midline, symmetric L/R movements  Motor: Strength 5/5 in upper/lower extremities.   Sensation to light touch intact in face, upper/lower extremities. No pronator drift. No leg drop.  Cerebellar: No ataxia with finger to nose.  Steady gait. No truncal sway. Normal Romberg.   Psychiatric:        Behavior: Behavior normal.        Thought Content:  Thought content normal.        Judgment: Judgment normal.     ED Results / Procedures / Treatments   Labs (all labs ordered are listed, but only abnormal results are displayed) Labs Reviewed  BASIC METABOLIC PANEL - Abnormal; Notable for the following components:      Result Value   Glucose, Bld 118 (*)    BUN 5 (*)    All other components within normal limits  RESP PANEL BY RT-PCR (FLU A&B, COVID) ARPGX2  CBC WITH DIFFERENTIAL/PLATELET  I-STAT BETA HCG BLOOD, ED ([facility], [facility], [facility] ONLY)  TROPONIN I (HIGH SENSITIVITY)    EKG None  Radiology DG Chest Portable 1 View  Result Date: 03/09/2020 CLINICAL DATA:  Productive cough EXAM: PORTABLE CHEST 1 VIEW COMPARISON:  04/05/2017 FINDINGS: The heart size and mediastinal contours are within normal limits. Both lungs are clear. The visualized skeletal structures are unremarkable. IMPRESSION: No active disease. Electronically Signed   By: Inez Catalina M.D.   On: 03/09/2020 08:23    Procedures Procedures (including critical care time)  Medications Ordered in ED Medications  sodium chloride 0.9 % bolus 1,000 mL (1,000 mLs Intravenous New Bag/Given 03/09/20 0922)  dexamethasone (DECADRON) injection 8 mg (8 mg Intravenous Given 03/09/20 0904)  metoCLOPramide (REGLAN) injection 10 mg (10 mg Intravenous Given 03/09/20 0903)  diphenhydrAMINE (BENADRYL) injection 12.5 mg (12.5 mg Intravenous Given 03/09/20 0903)  ketorolac (TORADOL) 30 MG/ML injection 30 mg (30 mg Intravenous Given 03/09/20 6222)    ED Course  I have reviewed the triage vital signs and the nursing notes.  Pertinent labs & imaging results that were available during my care of the patient were reviewed by me and considered in my medical decision making (see chart for details).  Clinical Course as of Mar 09 1016  Sat Mar 09, 2020  0734 Unsure about migraine history Seizure history maternal grandfather, aunt, cousin   [CG]  27 Takes 4-5 migraines every week. Had a headache  Monday/tues    [CG]  0933  IMPRESSION: No active disease.    DG Chest Portable 1 View [CG]  [id number] Reevaluated patient.  Reports headache "much improved".   [CG]    Clinical  Course User Index [CG] Arlean Hopping   MDM Rules/Calculators/A&P                          34 year old female with chronic migraines and recurrent syncopal episodes since age of 62 sent to the ED by urgent care for evaluation of headache. Patient tells me her headache today is "exact" as her usual headaches but more severe.    No red flags like preceding trauma, oral anticoagulant use, fever, neck pain or rigidity, neuro deficits on exam.    EMR, triage nursing notes reviewed to obtain more history and assist with MDM.  Recently seen 02/23/2020 at outside hospital ER for syncope.  Had extensive work-up including normal lab work, troponins, EKG, lab work, urinalysis, CT head and cervical spine, negative respiratory panel.  Labs, EKG, chest x-ray ordered as above  ER work-up personally visualized and interpreted  Labs are vastly reassuring.  No electrolyte abnormalities.  Normal hemoglobin.  Troponin is 3.  Negative hCG.  Respiratory panel pending  Imaging also reassuring, chest x-ray negative without infiltrates.  EKG without interval abnormalities, erythematous, ischemia, right ventricular strain.  Medicines ordered: Migraine cocktail with IV fluids  1000: Patient reevaluated and reports almost complete improvement in headache.  High suspicion for tension type headache vs migraine headache vs rebound headache vs status migrainosus.  Will discharge with neurology follow-up, Fioricet.  She describes pretty classic associated symptoms for chronic migraines.  Given chronicity of her headaches, with similar qualities is current headache today I doubt stroke, TIA, intracranial hemorrhage.  Doubt meningitis.  I do not think further neuroimaging is necessary today.  May need EEG or MRI as outpatient.  Patient  did not have syncope today but had transient lightheadedness, palpitations only.  No cardiac etiology found here in the ED, has been on cardiac monitor without any events.  Given chronicity of her symptoms she will benefit from outpatient cardiology follow-up, possible Holter monitor.  Doubt ACS, PE. PERC negative. Strong family history of seizure, but she denies post-ictal state. Syncopal episodes witnessed by family and no reported shaking. Patient is in agreement with plan.  Return precautions discussed.  Final Clinical Impression(s) / ED Diagnoses Final diagnoses:  Recurrent headache  Light headedness    Rx / DC Orders ED Discharge Orders         Ordered    butalbital-acetaminophen-caffeine (FIORICET) 50-325-40 MG tablet  Every 6 hours PRN        03/09/20 1017           Arlean Hopping 03/09/20 1018    Lacretia Leigh, MD 03/11/20 1341

## 2020-03-09 NOTE — Discharge Instructions (Signed)
You were seen in the emergency department for a headache and lightheadedness  Your headache improved here after medicines.  You had a recent ER visit at an outside hospital and had CT of your brain and cervical spine, extensive work-up which was all normal.  Lab work here today, chest x-ray, EKG were normal.  Cause of your symptoms is unclear.  We discussed some possibilities including migraines, rebound headache, tension headache.  Stay hydrated, sleep well.  Fatigue and poor sleep, stress, dehydration can be triggers of migraines.  I have prescribed Fioricet which is a medicine you can use as needed every 6 hours but I recommend using it sparingly and only if your headaches are not improving with over-the-counter ibuprofen or Tylenol.  Cautioned that overuse of ibuprofen and Tylenol can cause rebound headache.  You need evaluation by a headache specialist/neurologist, call their office soon as possible to make an appointment.  You had some lightheadedness today but there was no passing out episode.  Call cardiology for further discussion of your symptoms.  Return to the ED for any stroke symptoms, fever, chest pain or shortness of breath with exertion

## 2020-05-23 ENCOUNTER — Ambulatory Visit: Payer: Self-pay | Admitting: Neurology

## 2020-07-22 NOTE — Progress Notes (Signed)
[facility]    Provider:  Dr Jaynee Eagles Requesting Provider: Kinnie Feil, PA-C Primary Care Provider:  Patient, No Pcp Per (Inactive)  CC:  Headaches, possible seizures  HPI:  Elizabeth Ferguson is a 35 y.o. female here as requested by Kinnie Feil, PA-C for headaches. PMHx HLD, abnormal eeg(per patient), migrianes, episodes of alteration of awareness.  Patient is here for headaches and lightheadedness/alterations of awareness.  She has had headaches for as long as she can remember 13-14. She gets them about every other day, they can last all day, they will wake her up out of her sleep, she can get them if she does not get enough sleep, also certain lights, colors and sounds will bring them on, the pain can be located on the left, sometimes on the right, symptoms all over, pulsating pounding throbbing, she gets nauseated, no vomiting, movement makes them worse, no aura, she is tried Aleve, Advil, ibuprofen, she has been prescribed Fioricet. She also has a lot of neck pain. She has dizziness and headaches and has lost consciousness, happened when she was 16 as well and has happened over the years, feels dizzy, lightheaded, she completely goes, no abnormal movements, she has an upcoming cardiology appointment, no urination, tongue biting, not witnessed, brief, no post-ictal confusion she feels weak all over she laid on the floor. Last time it happened was 6 months ago. She has been walking or got up to wash hands when this happens. She does not drive, she uses [employer] and the bus. In college it was witnessed she was shopping, she was talking, her body went forward, eyes rolled back and she hit the floor. Have a Fhx of seizures. She wakes up shaking and it is scary and she can;t stop shaking but she is awake. 1st cusin has epilepsy, grandfater with seizures (both on mother's side), also more distant relatives on mother's side with epilepsy.  As a child she had an abnormal eeg. She  had sleep apnea as a child, she has no hx she has documents from social services as a child for any history, she is very tired during the day, she wakes up often. Morning headaches. No other focal neurologic deficits, associated symptoms, inciting events or modifiable factors.  Reviewed notes, labs and imaging from outside physicians, which showed:  From a thorough review of records, medications tried that can be used in migraine management includes Aleve, Advil, ibuprofen, Fioricet, Toradol injections, Mobic tablets, Reglan injections, naproxen, Benadryl, Zofran, Phenergan,  Reviewed report 02/23/2020:  FINDINGS:  Brain: No acute infarct, hemorrhage, or mass lesion is present. The  ventricles are of normal size. No significant white matter lesions  are present. No significant extraaxial fluid collection is present.  The brainstem and cerebellum are within normal limits.   Vascular: No hyperdense vessel or unexpected calcification.   Skull: Calvarium is intact. No focal lytic or blastic lesions are  present. No significant extracranial soft tissue lesion is present.   Sinuses/Orbits: The paranasal sinuses and mastoid air cells are  clear. The globes and orbits are within normal limits.   IMPRESSION:  Negative CT of the head.    Review of Systems: Patient complains of symptoms per HPI as well as the following symptoms: headache. Pertinent negatives and positives per HPI. All others negative.   Social History   Socioeconomic History  . Marital status: Single    Spouse name: Not on file  . Number of children: Not on file  . Years of education:  Not on file  . Highest education level: Not on file  Occupational History  . Not on file  Tobacco Use  . Smoking status: Current Some Day Smoker  . Smokeless tobacco: Never Used  . Tobacco comment:  black and mild   Vaping Use  . Vaping Use: Some days  . Substances: Flavoring  Substance and Sexual Activity  . Alcohol use: Yes     Alcohol/week: 2.0 standard drinks    Types: 2 Glasses of wine per week    Comment: normally red wine   . Drug use: Yes    Frequency: 1.0 times per week    Types: Marijuana  . Sexual activity: Not Currently    Birth control/protection: Abstinence  Other Topics Concern  . Not on file  Social History Narrative   Lives alone   Left handed   Caffeine: 1 cup some days    Social Determinants of Health   Financial Resource Strain: Not on file  Food Insecurity: Not on file  Transportation Needs: Not on file  Physical Activity: Not on file  Stress: Not on file  Social Connections: Not on file  Intimate Partner Violence: Not on file    Family History  Problem Relation Age of Onset  . Diabetes Mother   . Heart failure Mother   . Stroke Mother   . COPD Mother   . Diabetes Father   . Heart failure Father   . Stroke Father   . COPD Father   . Pulmonary embolism Father   . Cancer Other        maternal side  . Epilepsy Cousin   . Other Maternal Uncle        blood clot in brain    Past Medical History:  Diagnosis Date  . Acne   . Eczema   . Endometriosis   . Gallstone   . Hyperlipidemia   . Irregular menstrual cycle   . Ovarian cyst   . Pelvic pain in female     Patient Active Problem List   Diagnosis Date Noted  . Chronic migraine without aura without status migrainosus, not intractable 07/24/2020  . Alteration of awareness 07/24/2020  . Syncope 07/24/2020  . FHx: seizures 07/24/2020    Past Surgical History:  Procedure Laterality Date  . CHOLECYSTECTOMY    . LAPAROSCOPY N/A 11/13/2014   Procedure: LAPAROSCOPY DIAGNOSTIC WITH PELVIC BIOPSY;  Surgeon: Ena Dawley, MD;  Location: [facility];  Service: Gynecology;  Laterality: N/A;  . LYSIS OF ADHESION N/A 11/13/2014   Procedure: LYSIS OF ADHESION;  Surgeon: Ena Dawley, MD;  Location[facility];  Service: Gynecology;  Laterality: N/A;    Current Outpatient Medications   Medication Sig Dispense Refill  . ALPRAZolam (XANAX) 0.25 MG tablet Take 1-2 tabs (0.25mg -0.50mg ) 30-60 minutes before procedure. May repeat if needed.Do not drive. 4 tablet 0  . ondansetron (ZOFRAN-ODT) 4 MG disintegrating tablet Take 1-2 tablets (4-8 mg total) by mouth every 8 (eight) hours as needed. For nausea or migraine. May take with Rizatriptan. 30 tablet 3  . rizatriptan (MAXALT-MLT) 10 MG disintegrating tablet Take 1 tablet (10 mg total) by mouth as needed for migraine. May repeat in 2 hours if needed 9 tablet 11  . topiramate (TOPAMAX) 50 MG tablet Take 1 tablet (50 mg total) by mouth 2 (two) times daily. 60 tablet 6   No current facility-administered medications for this visit.    Allergies as of 07/23/2020 - Review Complete 07/23/2020  Allergen  Reaction Noted  . Pineapple Anaphylaxis 03/09/2020    Vitals: BP (!) 158/98 (BP Location: Left Arm, Patient Position: Sitting) Comment: pt nervous  Pulse 85   Ht 5' 6.5" (1.689 m)   Wt 215 lb (97.5 kg)   BMI 34.18 kg/m  Last Weight:  Wt Readings from Last 1 Encounters:  07/23/20 215 lb (97.5 kg)   Last Height:   Ht Readings from Last 1 Encounters:  07/23/20 5' 6.5" (1.689 m)     Physical exam: Exam: Gen: NAD, conversant, well nourised, obese, well groomed                     CV: RRR, no MRG. No Carotid Bruits. No peripheral edema, warm, nontender Eyes: Conjunctivae clear without exudates or hemorrhage  Neuro: Detailed Neurologic Exam  Speech:    Speech is normal; fluent and spontaneous with normal comprehension.  Cognition:    The patient is oriented to person, place, and time;     recent and remote memory intact;     language fluent;     normal attention, concentration,     fund of knowledge Cranial Nerves:    The pupils are equal, round, and reactive to light. The fundi are flat. Visual fields are full to finger confrontation. Extraocular movements are intact. Trigeminal sensation is intact and the muscles of  mastication are normal. The face is symmetric. The palate elevates in the midline. Hearing intact. Voice is normal. Shoulder shrug is normal. The tongue has normal motion without fasciculations.   Coordination:    Normal finger to nose and heel to shin. Normal rapid alternating movements.   Gait:    Heel-toe and tandem gait are normal.   Motor Observation:    No asymmetry, no atrophy, and no involuntary movements noted. Tone:    Normal muscle tone.    Posture:    Posture is normal. normal erect    Strength:    Strength is V/V in the upper and lower limbs.      Sensation: intact to LT     Reflex Exam:  DTR's:    Deep tendon reflexes in the upper and lower extremities are normal bilaterally.   Toes:    The toes are downgoing bilaterally.   Clonus:    Clonus is absent.    Assessment/Plan: This is a 35 year old female with history of migraines, history of abnormal EEG per patient, episodes of loss of consciousness and alterations of awareness.  She reports she has had migraines for as long as she can remember since age of 65-14, she gets them every other day, migrainous in description.  She reports that she has had multiple episodes of alteration's of awareness, loss of consciousness, associated with dizziness and lightheadedness suspicious for vasovagal or orthostatic syncope however also states there was one episode that her sister witnessed where she had alteration of awareness, she "hit the floor", "eyes rolled back", as well as an abnormal EEG so patient needs a thorough evaluation for seizures.  - Routine blood work today - NEEDS PCP: Gave her some suggestions and also ask her to call her insurance - MRI of the brain w/wo contrast - will give you some xanax for claustrophobia - EEG here in the office - if negative we may refer to [facility] for admission to the EMU(Epilepsy Monitoring Unit) - Topamax 50mg  twice a day (discussed teratogenicity, do not get pregnant) - this can help  with migraines (and is also a seizure medication) - Emergency  migraine medication: Rizatriptan and Ondansetron: Please take one tablet at the onset of your headache. If it does not improve the symptoms please take one additional tablet. Do not take more then 2 tablets in 24hrs. Do not take use more then 2 to 3 times in a week. - ESS 17 and morning headaches: Sleep evaluation, will ask Dr. Brett Fairy to consider an overnight EEG during her sleep study as clinically warranted.  Patient also states that as a child she was formally diagnosed with obstructive sleep apnea.  Orders Placed This Encounter  Procedures  . MR BRAIN W WO CONTRAST  . CBC with Differential/Platelets  . Comprehensive metabolic panel  . TSH  . Ambulatory referral to Sleep Studies  . EEG adult   Meds ordered this encounter  Medications  . topiramate (TOPAMAX) 50 MG tablet    Sig: Take 1 tablet (50 mg total) by mouth 2 (two) times daily.    Dispense:  60 tablet    Refill:  6  . rizatriptan (MAXALT-MLT) 10 MG disintegrating tablet    Sig: Take 1 tablet (10 mg total) by mouth as needed for migraine. May repeat in 2 hours if needed    Dispense:  9 tablet    Refill:  11  . ondansetron (ZOFRAN-ODT) 4 MG disintegrating tablet    Sig: Take 1-2 tablets (4-8 mg total) by mouth every 8 (eight) hours as needed. For nausea or migraine. May take with Rizatriptan.    Dispense:  30 tablet    Refill:  3  . ALPRAZolam (XANAX) 0.25 MG tablet    Sig: Take 1-2 tabs (0.25mg -0.50mg ) 30-60 minutes before procedure. May repeat if needed.Do not drive.    Dispense:  4 tablet    Refill:  0    Cc: Kinnie Feil, PA-C,  Patient, No Pcp Per (Inactive)  Sarina Ill, MD  [facility] [address] [place], [place] [address]  Phone [phone] Fax [phone]

## 2020-07-23 ENCOUNTER — Encounter: Payer: Self-pay | Admitting: Neurology

## 2020-07-23 ENCOUNTER — Ambulatory Visit (INDEPENDENT_AMBULATORY_CARE_PROVIDER_SITE_OTHER): Payer: BC Managed Care – PPO | Admitting: Neurology

## 2020-07-23 ENCOUNTER — Other Ambulatory Visit: Payer: Self-pay

## 2020-07-23 VITALS — BP 158/98 | HR 85 | Ht 66.5 in | Wt 215.0 lb

## 2020-07-23 DIAGNOSIS — Z8489 Family history of other specified conditions: Secondary | ICD-10-CM | POA: Diagnosis not present

## 2020-07-23 DIAGNOSIS — G43709 Chronic migraine without aura, not intractable, without status migrainosus: Secondary | ICD-10-CM

## 2020-07-23 DIAGNOSIS — R55 Syncope and collapse: Secondary | ICD-10-CM | POA: Diagnosis not present

## 2020-07-23 DIAGNOSIS — R519 Headache, unspecified: Secondary | ICD-10-CM

## 2020-07-23 DIAGNOSIS — G4733 Obstructive sleep apnea (adult) (pediatric): Secondary | ICD-10-CM

## 2020-07-23 DIAGNOSIS — R419 Unspecified symptoms and signs involving cognitive functions and awareness: Secondary | ICD-10-CM

## 2020-07-23 DIAGNOSIS — R569 Unspecified convulsions: Secondary | ICD-10-CM

## 2020-07-23 DIAGNOSIS — G4719 Other hypersomnia: Secondary | ICD-10-CM

## 2020-07-23 MED ORDER — RIZATRIPTAN BENZOATE 10 MG PO TBDP: 10.0000 mg | ORAL_TABLET | ORAL | 11 refills | Status: DC | PRN

## 2020-07-23 MED ORDER — TOPIRAMATE 50 MG PO TABS: 50.0000 mg | ORAL_TABLET | Freq: Two times a day (BID) | ORAL | 6 refills | Status: DC

## 2020-07-23 MED ORDER — ALPRAZOLAM 0.25 MG PO TABS
ORAL_TABLET | ORAL | 0 refills | Status: AC
Start: 2020-07-23 — End: ?

## 2020-07-23 MED ORDER — ONDANSETRON 4 MG PO TBDP
4.0000 mg | ORAL_TABLET | Freq: Three times a day (TID) | ORAL | 3 refills | Status: DC | PRN
Start: 2020-07-23 — End: 2020-11-21

## 2020-07-23 NOTE — Patient Instructions (Addendum)
Routine blood work today NEEDS PCP: Elizabeth Ferguson at [facility] MRI of the brain w/wo contrast - will give you some xanax for claustrophobia EEG here in the office - if negative we may refer to [facility] for admission to the EMU(Epilepsy Monitoring Unit) Topamax 50mg  twice a day (discussed teratogenicity, do not get pregnant) - this can help with migraines (and is also a seizure medication) Emergency Medication: Rizatriptan and Ondansetron: Please take one tablet at the onset of your headache. If it does not improve the symptoms please take one additional tablet. Do not take more then 2 tablets in 24hrs. Do not take use more then 2 to 3 times in a week.  Seizures and Epilepsy  What is a seizure? A brief, strong surge of abnormal electrical activity affects part or all of the brain, which leads to transient symptoms and signs from convulsions and loss of consciousness to more subtle symptoms such as blank staring, lip smacking, or jerking movements of arms and legs. It can occur due to provoking factors, such as fever, infection, alcohol, drugs, certain medications, or other medical conditions.  What is Epilepsy? Chronic/two or more recurrent unprovoked seizures from an underlying neurologic condition.  First Aid for Seizures When providing seizure first aid for generalized tonic clonic (grand mal) seizures, these are the key things to remember: ? Keep calm and reassure other people who may be nearby.  ? Don't hold the person down or try to stop his movements.  ? Time the seizure with your watch.  ? Clear the area around the person of anything hard or sharp.  ? Loosen ties or anything around the neck that may make breathing difficult.  ? Put something flat and soft, like a folded jacket, under the head.  ? Turn him or her gently onto one side. This will help keep the airway clear.  ? Do not try to force the mouth open with any hard implement or with fingers. A person having a seizure  CANNOT swallow his tongue. Efforts to hold the tongue down can injure teeth or jaw.  ? Don't attempt artificial respiration except in the unlikely event that a person does not start breathing again after the seizure has stopped.  ? Stay with the person until the seizure ends naturally.  ? Be friendly and reassuring as consciousness returns.  ? Offer to call a taxi, friend or relative to help the person get home if he seems confused or unable to get home by himself.   Is an Emergency Room Visit Needed? An un-complicated generalized tonic clonic (grand mal) seizure in someone who has epilepsy is not a medical emergency, even though it looks like one. It stops naturally after a few minutes without ill effects. The average person is able to continue about his business after a rest period, and may need only limited assistance, or no assistance at all, in getting home. In other circumstances, an ambulance should be called. Also, when these conditions exist, immediate medical attention is necessary: diabetes, brain infections, heat exhaustion, pregnancy, poisoning, hypoglycemia, high fever, head injury, etc.  No Need to Call an Ambulance if -- ? medical I.D. jewelry or card says "epilepsy," and  ? the seizure ends in under five minutes, and  ? consciousness returns without further incident, and  ? there are no signs of injury, physical distress, or pregnancy.   An Ambulance Should Be Called if -- ? the seizure has happened in water.  ? there's no medical I.D.,  and no way of knowing whether the seizure is caused by epilepsy.  ? the person is pregnant, injured, or diabetic.  ? the seizure continues for more than five minutes.  ? a second seizure starts shortly after the first has ended.  ? consciousness does not start to return after the shaking has stopped.  ? the person has been injured as a result of the seizure   If the ambulance arrives after consciousness has returned, the person should be  asked whether the seizure was associated with epilepsy and whether emergency room care is wanted.  Department of Motor Vehicle [facility]) of [place] regulations for seizures - It is the patient's responsibility to report the incidence of the seizure in the state of [place]. [place] has no statutory provision requiring physicians to report patients diagnosed with epilepsy or seizures to a central state agency.  The recommended requirement for a driver's license in [place] for an individual with epilepsy is that they be seizure-free for 6-12 months. However, the DMV may consider certain exceptions to this general rule. The Department learns of an individual's condition by inquiring on the application form or renewal form, a physician's report to the [facility], an accident report or from correspondence from the individual. The person may be required to submit a Medical Report Form either annually or semi-annually.  A person whose license has been denied for medical reasons may request a hearing in writing within 10 days of the notice of cancellation. The licensee may retain his or her license during the hearing process until advised otherwise by the Review Board.  Epilepsy Website for More Information  http://www.epilepsyfoundation.org     Ondansetron oral dissolving tablet What is this medicine? ONDANSETRON (on DAN se tron) is used to treat nausea and vomiting caused by chemotherapy. It is also used to prevent or treat nausea and vomiting after surgery. This medicine may be used for other purposes; ask your health care provider or pharmacist if you have questions. COMMON BRAND NAME(S): Zofran ODT What should I tell my health care provider before I take this medicine? They need to know if you have any of these conditions:  heart disease  history of irregular heartbeat  liver disease  low levels of magnesium or potassium in the blood  an unusual or allergic reaction to ondansetron, granisetron, other  medicines, foods, dyes, or preservatives  pregnant or trying to get pregnant  breast-feeding How should I use this medicine? These tablets are made to dissolve in the mouth. Do not try to push the tablet through the foil backing. With dry hands, peel away the foil backing and gently remove the tablet. Place the tablet in the mouth and allow it to dissolve, then swallow. While you may take these tablets with water, it is not necessary to do so. Talk to your pediatrician regarding the use of this medicine in children. Special care may be needed. Overdosage: If you think you have taken too much of this medicine contact a poison control center or emergency room at once. NOTE: This medicine is only for you. Do not share this medicine with others. What if I miss a dose? If you miss a dose, take it as soon as you can. If it is almost time for your next dose, take only that dose. Do not take double or extra doses. What may interact with this medicine? Do not take this medicine with any of the following medications:  apomorphine  certain medicines for fungal infections like fluconazole,  itraconazole, ketoconazole, posaconazole, voriconazole  cisapride  dronedarone  pimozide  thioridazine This medicine may also interact with the following medications:  carbamazepine  certain medicines for depression, anxiety, or psychotic disturbances  fentanyl  linezolid  MAOIs like Carbex, Eldepryl, Marplan, Nardil, and Parnate  methylene blue (injected into a vein)  other medicines that prolong the QT interval (cause an abnormal heart rhythm) like dofetilide, ziprasidone  phenytoin  rifampicin  tramadol This list may not describe all possible interactions. Give your health care provider a list of all the medicines, herbs, non-prescription drugs, or dietary supplements you use. Also tell them if you smoke, drink alcohol, or use illegal drugs. Some items may interact with your medicine. What  should I watch for while using this medicine? Check with your doctor or health care professional as soon as you can if you have any sign of an allergic reaction. What side effects may I notice from receiving this medicine? Side effects that you should report to your doctor or health care professional as soon as possible:  allergic reactions like skin rash, itching or hives, swelling of the face, lips, or tongue  breathing problems  confusion  dizziness  fast or irregular heartbeat  feeling faint or lightheaded, falls  fever and chills  loss of balance or coordination  seizures  sweating  swelling of the hands and feet  tightness in the chest  tremors  unusually weak or tired Side effects that usually do not require medical attention (report to your doctor or health care professional if they continue or are bothersome):  constipation or diarrhea  headache This list may not describe all possible side effects. Call your doctor for medical advice about side effects. You may report side effects to FDA at 1-800-FDA-1088. Where should I keep my medicine? Keep out of the reach of children. Store between 2 and 30 degrees C (36 and 86 degrees F). Throw away any unused medicine after the expiration date. NOTE: This sheet is a summary. It may not cover all possible information. If you have questions about this medicine, talk to your doctor, pharmacist, or health care provider.  2021 Elsevier/Gold Standard (2018-03-15 07:14:10) Rizatriptan disintegrating tablets What is this medicine? RIZATRIPTAN (rye za TRIP tan) is used to treat migraines with or without aura. An aura is a strange feeling or visual disturbance that warns you of an attack. It is not used to prevent migraines. This medicine may be used for other purposes; ask your health care provider or pharmacist if you have questions. COMMON BRAND NAME(S): Maxalt-MLT What should I tell my health care provider before I take this  medicine? They need to know if you have any of these conditions:  cigarette smoker  circulation problems in fingers and toes  diabetes  heart disease  high blood pressure  high cholesterol  history of irregular heartbeat  history of stroke  kidney disease  liver disease  stomach or intestine problems  an unusual or allergic reaction to rizatriptan, other medicines, foods, dyes, or preservatives  pregnant or trying to get pregnant  breast-feeding How should I use this medicine? Take this medicine by mouth. Follow the directions on the prescription label. Leave the tablet in the sealed blister pack until you are ready to take it. With dry hands, open the blister and gently remove the tablet. If the tablet breaks or crumbles, throw it away and take a new tablet out of the blister pack. Place the tablet in the mouth and allow it to  dissolve, and then swallow. Do not cut, crush, or chew this medicine. You do not need water to take this medicine. Do not take it more often than directed. Talk to your pediatrician regarding the use of this medicine in children. While this drug may be prescribed for children as young as 6 years for selected conditions, precautions do apply. Overdosage: If you think you have taken too much of this medicine contact a poison control center or emergency room at once. NOTE: This medicine is only for you. Do not share this medicine with others. What if I miss a dose? This does not apply. This medicine is not for regular use. What may interact with this medicine? Do not take this medicine with any of the following medicines:  certain medicines for migraine headache like almotriptan, eletriptan, frovatriptan, naratriptan, rizatriptan, sumatriptan, zolmitriptan  ergot alkaloids like dihydroergotamine, ergonovine, ergotamine, methylergonovine  MAOIs like Carbex, Eldepryl, Marplan, Nardil, and Parnate This medicine may also interact with the following  medications:  certain medicines for depression, anxiety, or psychotic disorders  propranolol This list may not describe all possible interactions. Give your health care provider a list of all the medicines, herbs, non-prescription drugs, or dietary supplements you use. Also tell them if you smoke, drink alcohol, or use illegal drugs. Some items may interact with your medicine. What should I watch for while using this medicine? Visit your healthcare professional for regular checks on your progress. Tell your healthcare professional if your symptoms do not start to get better or if they get worse. You may get drowsy or dizzy. Do not drive, use machinery, or do anything that needs mental alertness until you know how this medicine affects you. Do not stand up or sit up quickly, especially if you are an older patient. This reduces the risk of dizzy or fainting spells. Alcohol may interfere with the effect of this medicine. Your mouth may get dry. Chewing sugarless gum or sucking hard candy and drinking plenty of water may help. Contact your healthcare professional if the problem does not go away or is severe. If you take migraine medicines for 10 or more days a month, your migraines may get worse. Keep a diary of headache days and medicine use. Contact your healthcare professional if your migraine attacks occur more frequently. What side effects may I notice from receiving this medicine? Side effects that you should report to your doctor or health care professional as soon as possible:  allergic reactions like skin rash, itching or hives, swelling of the face, lips, or tongue  chest pain or chest tightness  signs and symptoms of a dangerous change in heartbeat or heart rhythm like chest pain; dizziness; fast, irregular heartbeat; palpitations; feeling faint or lightheaded; falls; breathing problems  signs and symptoms of a stroke like changes in vision; confusion; trouble speaking or understanding;  severe headaches; sudden numbness or weakness of the face, arm or leg; trouble walking; dizziness; loss of balance or coordination  signs and symptoms of serotonin syndrome like irritable; confusion; diarrhea; fast or irregular heartbeat; muscle twitching; stiff muscles; trouble walking; sweating; high fever; seizures; chills; vomiting Side effects that usually do not require medical attention (report to your doctor or health care professional if they continue or are bothersome):  diarrhea  dizziness  drowsiness  dry mouth  headache  nausea, vomiting  pain, tingling, numbness in the hands or feet  stomach pain This list may not describe all possible side effects. Call your doctor for medical advice about  side effects. You may report side effects to FDA at 1-800-FDA-1088. Where should I keep my medicine? Keep out of the reach of children. Store at room temperature between 15 and 30 degrees C (59 and 86 degrees F). Protect from light and moisture. Throw away any unused medicine after the expiration date. NOTE: This sheet is a summary. It may not cover all possible information. If you have questions about this medicine, talk to your doctor, pharmacist, or health care provider.  2021 Elsevier/Gold Standard (2017-10-05 14:58:08) Topiramate tablets What is this medicine? TOPIRAMATE (toe PYRE a mate) is used to treat seizures in adults or children with epilepsy. It is also used for the prevention of migraine headaches. This medicine may be used for other purposes; ask your health care provider or pharmacist if you have questions. COMMON BRAND NAME(S): Topamax, Topiragen What should I tell my health care provider before I take this medicine? They need to know if you have any of these conditions:  bleeding disorder  kidney disease  lung disease  suicidal thoughts, plans, or attempt  an unusual or allergic reaction to topiramate, other medicines, foods, dyes, or  preservatives  pregnant or trying to get pregnant  breast-feeding How should I use this medicine? Take this medicine by mouth with a glass of water. Follow the directions on the prescription label. Do not cut, crush or chew this medicine. Swallow the tablets whole. You can take it with or without food. If it upsets your stomach, take it with food. Take your medicine at regular intervals. Do not take it more often than directed. Do not stop taking except on your doctor's advice. A special MedGuide will be given to you by the pharmacist with each prescription and refill. Be sure to read this information carefully each time. Talk to your pediatrician regarding the use of this medicine in children. While this drug may be prescribed for children as young as 82 years of age for selected conditions, precautions do apply. Overdosage: If you think you have taken too much of this medicine contact a poison control center or emergency room at once. NOTE: This medicine is only for you. Do not share this medicine with others. What if I miss a dose? If you miss a dose, take it as soon as you can. If your next dose is to be taken in less than 6 hours, then do not take the missed dose. Take the next dose at your regular time. Do not take double or extra doses. What may interact with this medicine? This medicine may interact with the following medications:  acetazolamide  alcohol  antihistamines for allergy, cough, and cold  aspirin and aspirin-like medicines  atropine  birth control pills  certain medicines for anxiety or sleep  certain medicines for bladder problems like oxybutynin, tolterodine  certain medicines for depression like amitriptyline, fluoxetine, sertraline  certain medicines for seizures like carbamazepine, phenobarbital, phenytoin, primidone, valproic acid, zonisamide  certain medicines for stomach problems like dicyclomine, hyoscyamine  certain medicines for travel sickness like  scopolamine  certain medicines for Parkinson's disease like benztropine, trihexyphenidyl  certain medicines that treat or prevent blood clots like warfarin, enoxaparin, dalteparin, apixaban, dabigatran, and rivaroxaban  digoxin  general anesthetics like halothane, isoflurane, methoxyflurane, propofol  hydrochlorothiazide  ipratropium  lithium  medicines that relax muscles for surgery  metformin  narcotic medicines for pain  NSAIDs, medicines for pain and inflammation, like ibuprofen or naproxen  phenothiazines like chlorpromazine, mesoridazine, prochlorperazine, thioridazine  pioglitazone This list may  not describe all possible interactions. Give your health care provider a list of all the medicines, herbs, non-prescription drugs, or dietary supplements you use. Also tell them if you smoke, drink alcohol, or use illegal drugs. Some items may interact with your medicine. What should I watch for while using this medicine? Visit your doctor or health care professional for regular checks on your progress. Tell your health care professional if your symptoms do not start to get better or if they get worse. Do not stop taking except on your health care professional's advice. You may develop a severe reaction. Your health care professional will tell you how much medicine to take. Wear a medical ID bracelet or chain. Carry a card that describes your disease and details of your medicine and dosage times. This medicine can reduce the response of your body to heat or cold. Dress warm in cold weather and stay hydrated in hot weather. If possible, avoid extreme temperatures like saunas, hot tubs, very hot or cold showers, or activities that can cause dehydration such as vigorous exercise. Check with your health care professional if you have severe diarrhea, nausea, and vomiting, or if you sweat a lot. The loss of too much body fluid may make it dangerous for you to take this medicine. You may get  drowsy or dizzy. Do not drive, use machinery, or do anything that needs mental alertness until you know how this medicine affects you. Do not stand up or sit up quickly, especially if you are an older patient. This reduces the risk of dizzy or fainting spells. Alcohol may interfere with the effect of this medicine. Avoid alcoholic drinks. Tell your health care professional right away if you have any change in your eyesight. Patients and their families should watch out for new or worsening depression or thoughts of suicide. Also watch out for sudden changes in feelings such as feeling anxious, agitated, panicky, irritable, hostile, aggressive, impulsive, severely restless, overly excited and hyperactive, or not being able to sleep. If this happens, especially at the beginning of treatment or after a change in dose, call your healthcare professional. This medicine may cause serious skin reactions. They can happen weeks to months after starting the medicine. Contact your health care provider right away if you notice fevers or flu-like symptoms with a rash. The rash may be red or purple and then turn into blisters or peeling of the skin. Or, you might notice a red rash with swelling of the face, lips or lymph nodes in your neck or under your arms. Birth control may not work properly while you are taking this medicine. Talk to your health care professional about using an extra method of birth control. Women should inform their health care professional if they wish to become pregnant or think they might be pregnant. There is a potential for serious side effects and harm to an unborn child. Talk to your health care professional for more information. What side effects may I notice from receiving this medicine? Side effects that you should report to your doctor or health care professional as soon as possible:  allergic reactions like skin rash, itching or hives, swelling of the face, lips, or tongue  blood in the  urine  changes in vision  confusion  loss of memory  pain in lower back or side  pain when urinating  redness, blistering, peeling or loosening of the skin, including inside the mouth  signs and symptoms of bleeding such as bloody or black, tarry  stools; red or dark brown urine; spitting up blood or brown material that looks like coffee grounds; red spots on the skin; unusual bruising or bleeding from the eyes, gums, or nose  signs and symptoms of increased acid in the body like breathing fast; fast heartbeat; headache; confusion; unusually weak or tired; nausea, vomiting  suicidal thoughts, mood changes  trouble speaking or understanding  unusual sweating  unusually weak or tired Side effects that usually do not require medical attention (report to your doctor or health care professional if they continue or are bothersome):  dizziness  drowsiness  fever  loss of appetite  nausea, vomiting  pain, tingling, numbness in the hands or feet  stomach pain  tiredness  upset stomach This list may not describe all possible side effects. Call your doctor for medical advice about side effects. You may report side effects to FDA at 1-800-FDA-1088. Where should I keep my medicine? Keep out of the reach of children and pets. Store between 15 and 30 degrees C (59 and 86 degrees F). Protect from moisture. Keep the container tightly closed. Get rid of any unused medicine after the expiration date. To get rid of medicines that are no longer needed or have expired:  Take the medicine to a medicine take-back program. Check with your pharmacy or law enforcement to find a location.  If you cannot return the medicine, check the label or package insert to see if the medicine should be thrown out in the garbage or flushed down the toilet. If you are not sure, ask your health care provider. If it is safe to put it in the trash, empty the medicine out of the container. Mix the medicine with  cat litter, dirt, coffee grounds, or other unwanted substance. Seal the mixture in a bag or container. Put it in the trash. NOTE: This sheet is a summary. It may not cover all possible information. If you have questions about this medicine, talk to your doctor, pharmacist, or health care provider.  2021 Elsevier/Gold Standard (2019-10-05 15:41:57)  Alprazolam tablets What is this medicine? ALPRAZOLAM (al PRAY zoe lam) is a benzodiazepine. It is used to treat anxiety and panic attacks. This medicine may be used for other purposes; ask your health care provider or pharmacist if you have questions. COMMON BRAND NAME(S): Xanax What should I tell my health care provider before I take this medicine? They need to know if you have any of these conditions:  depression or other mental health disease  history of alcohol or medicine abuse or addiction  kidney disease  liver disease  lung disease, asthma, or breathing problem  seizures  suicidal thoughts, plans or attempt  an unusual or allergic reaction to alprazolam, other benzodiazepines, foods, dyes, or preservatives  pregnant or trying to get pregnant  breast-feeding How should I use this medicine? Take this medicine by mouth. Take it as directed on the prescription label. Do not take it more often than directed. Keep taking it unless your health care provider tells you to stop. A special MedGuide will be given to you by the pharmacist with each prescription and refill. Be sure to read this information carefully each time. Talk to your health care provider about the use of this medicine in children. Special care may be needed. Patients over 46 years of age may have a stronger reaction and need a smaller dose. Overdosage: If you think you have taken too much of this medicine contact a poison control center or emergency room  at once. NOTE: This medicine is only for you. Do not share this medicine with others. What if I miss a dose? If  you miss a dose, take it as soon as you can. If it is almost time for your next dose, take only that dose. Do not take double or extra doses. What may interact with this medicine? Do not take this medicine with any of the following medications:  certain antivirals for HIV or hepatitis  certain medicines for fungal infections like ketoconazole, itraconazole, or posaconazole  clarithromycin  grapefruit juice  narcotic medicines for cough  sodium oxybate This medicine may also interact with the following medications:  alcohol  antihistamines for allergy, cough and cold  certain medicines for anxiety or sleep  certain medicines for depression like amitriptyline, fluoxetine, fluvoxamine, nefazodone, sertraline  certain medicines for seizures like carbamazepine, phenobarbital, phenytoin, primidone  cimetidine  digoxin  erythromycin  female hormones, like estrogens or progestins and birth control pills, patches, rings, or injections  general anesthetics like halothane, isoflurane, methoxyflurane, propofol  medicines that relax muscles  narcotic medicines for pain  phenothiazines like chlorpromazine, mesoridazine, prochlorperazine, thioridazine This list may not describe all possible interactions. Give your health care provider a list of all the medicines, herbs, non-prescription drugs, or dietary supplements you use. Also tell them if you smoke, drink alcohol, or use illegal drugs. Some items may interact with your medicine. What should I watch for while using this medicine? Visit your health care provider for regular checks on your progress. Tell your health care provider if your symptoms do not start to get better or if they get worse. Do not stop taking except on your doctor's advice. You may develop a severe reaction. Your doctor will tell you how much medicine to take. You may get drowsy or dizzy. Do not drive, use machinery, or do anything that needs mental alertness  until you know how this medicine affects you. To reduce the risk of dizzy and fainting spells, do not stand or sit up quickly, especially if you are an older patient. Alcohol may increase dizziness and drowsiness. Avoid alcoholic drinks. If you are taking another medicine that also causes drowsiness, you may have more side effects. Give your health care provider a list of all medicines you use. Your doctor will tell you how much medicine to take. Do not take more medicine than directed. Call emergency for help if you have problems breathing or unusual sleepiness. Women should inform their health care provider if they wish to become pregnant or think they might be pregnant. Do not breast-feed while taking this medicine. Talk to your health care provider for more information. What side effects may I notice from receiving this medicine? Side effects that you should report to your doctor or health care professional as soon as possible:  allergic reactions like skin rash, itching or hives, swelling of the face, lips, or tongue  confusion  loss of balance or coordination  signs and symptoms of low blood pressure like dizziness; feeling faint or lightheaded, falls; unusually weak or tired  suicidal thoughts or other mood changes  trouble breathing  trouble saying things clearly Side effects that usually do not require medical attention (report to your doctor or health care professional if they continue or are bothersome):  changes in sex drive  drowsiness  dry mouth  tiredness This list may not describe all possible side effects. Call your doctor for medical advice about side effects. You may report side effects to  FDA at 1-800-FDA-1088. Where should I keep my medicine? Keep out of the reach of children and pets. This medicine can be abused. Keep it in a safe place to protect it from theft. Do not share it with anyone. It is only for you. Selling or giving away this medicine is dangerous and  against the law. Store at room temperature between 20 and 25 degrees C (68 and 77 degrees F). Get rid of any unused medicine after the expiration date. This medicine may cause harm and death if it is taken by other adults, children, or pets. It is important to get rid of the medicine as soon as you no longer need it or it is expired. You can do this in two ways:  Take the medicine to a medicine take-back program. Check with your pharmacy or law enforcement to find a location.  If you cannot return the medicine, check the label or package insert to see if the medicine should be thrown out in the garbage or flushed down the toilet. If you are not sure, ask your health care provider. If it is safe to put it in the trash, take the medicine out of the container. Mix the medicine with cat litter, dirt, coffee grounds, or other unwanted substance. Seal the mixture in a bag or container. Put it in the trash. NOTE: This sheet is a summary. It may not cover all possible information. If you have questions about this medicine, talk to your doctor, pharmacist, or health care provider.  2021 [employer] Standard (2019-09-25 15:56:13)

## 2020-07-24 ENCOUNTER — Other Ambulatory Visit: Payer: Self-pay | Admitting: *Deleted

## 2020-07-24 ENCOUNTER — Encounter: Payer: Self-pay | Admitting: Neurology

## 2020-07-24 DIAGNOSIS — R55 Syncope and collapse: Secondary | ICD-10-CM | POA: Insufficient documentation

## 2020-07-24 DIAGNOSIS — Z8489 Family history of other specified conditions: Secondary | ICD-10-CM | POA: Insufficient documentation

## 2020-07-24 DIAGNOSIS — G43709 Chronic migraine without aura, not intractable, without status migrainosus: Secondary | ICD-10-CM | POA: Insufficient documentation

## 2020-07-24 DIAGNOSIS — R419 Unspecified symptoms and signs involving cognitive functions and awareness: Secondary | ICD-10-CM | POA: Insufficient documentation

## 2020-07-24 LAB — COMPREHENSIVE METABOLIC PANEL
ALT: 14 IU/L (ref 0–32)
AST: 15 IU/L (ref 0–40)
Albumin/Globulin Ratio: 1.6 (ref 1.2–2.2)
Albumin: 4.5 g/dL (ref 3.8–4.8)
Alkaline Phosphatase: 81 IU/L (ref 44–121)
BUN/Creatinine Ratio: 10 (ref 9–23)
BUN: 7 mg/dL (ref 6–20)
Bilirubin Total: 0.3 mg/dL (ref 0.0–1.2)
CO2: 25 mmol/L (ref 20–29)
Calcium: 9.8 mg/dL (ref 8.7–10.2)
Chloride: 100 mmol/L (ref 96–106)
Creatinine, Ser: 0.69 mg/dL (ref 0.57–1.00)
Globulin, Total: 2.9 g/dL (ref 1.5–4.5)
Glucose: 110 mg/dL — ABNORMAL HIGH (ref 65–99)
Potassium: 5.1 mmol/L (ref 3.5–5.2)
Sodium: 138 mmol/L (ref 134–144)
Total Protein: 7.4 g/dL (ref 6.0–8.5)
eGFR: 118 mL/min/{1.73_m2} (ref >59)

## 2020-07-24 LAB — CBC WITH DIFFERENTIAL/PLATELET
Basophils Absolute: 0 10*3/uL (ref 0.0–0.2)
Basos: 0 %
EOS (ABSOLUTE): 0 10*3/uL (ref 0.0–0.4)
Eos: 0 %
Hematocrit: 41.8 % (ref 34.0–46.6)
Hemoglobin: 13.8 g/dL (ref 11.1–15.9)
Immature Grans (Abs): 0 10*3/uL (ref 0.0–0.1)
Immature Granulocytes: 0 %
Lymphocytes Absolute: 1.9 10*3/uL (ref 0.7–3.1)
Lymphs: 28 %
MCH: 30.3 pg (ref 26.6–33.0)
MCHC: 33 g/dL (ref 31.5–35.7)
MCV: 92 fL (ref 79–97)
Monocytes Absolute: 0.5 10*3/uL (ref 0.1–0.9)
Monocytes: 8 %
Neutrophils Absolute: 4.4 10*3/uL (ref 1.4–7.0)
Neutrophils: 64 %
Platelets: 390 10*3/uL (ref 150–450)
RBC: 4.56 x10E6/uL (ref 3.77–5.28)
RDW: 12.7 % (ref 11.7–15.4)
WBC: 6.8 10*3/uL (ref 3.4–10.8)

## 2020-07-24 LAB — TSH: TSH: 1.94 u[IU]/mL (ref 0.450–4.500)

## 2020-07-24 MED ORDER — RIZATRIPTAN BENZOATE 10 MG PO TABS: 10.0000 mg | ORAL_TABLET | ORAL | 11 refills | Status: DC | PRN

## 2020-07-24 NOTE — Telephone Encounter (Signed)
Called [facility] to get more info as to why patient could not pick up meds. Spoke with tech. Xanax had incorrect DEA # for Dr. Jaynee Eagles. Provided correct DEA: [id number]. He was able to process this prescription now. He re-ran zofran and he is now getting paid claim for this: $19.87.  He is still getting rejection for rizatriptan, stating drug not covered. Unclear if PA is needed. ID: [id number]. RXBIN: [id number]. Grp: [id number]. Phone# [phone]

## 2020-07-29 ENCOUNTER — Telehealth: Payer: Self-pay | Admitting: Neurology

## 2020-07-29 NOTE — Telephone Encounter (Signed)
spoke to the patient she states she will get back to me tos chedule  [employer] auth: [id number] (exp. 07/29/20 to 01/24/21)

## 2020-08-12 ENCOUNTER — Other Ambulatory Visit: Payer: BC Managed Care – PPO

## 2020-08-13 ENCOUNTER — Encounter: Payer: Self-pay | Admitting: Neurology

## 2020-09-03 ENCOUNTER — Telehealth: Payer: Self-pay | Admitting: Neurology

## 2020-09-03 NOTE — Telephone Encounter (Signed)
LVM to see if pt is still interested in scheduling MRI.

## 2020-09-16 ENCOUNTER — Institutional Professional Consult (permissible substitution): Payer: BC Managed Care – PPO | Admitting: Neurology

## 2020-11-12 ENCOUNTER — Institutional Professional Consult (permissible substitution): Payer: BC Managed Care – PPO | Admitting: Neurology

## 2020-11-12 ENCOUNTER — Telehealth: Payer: Self-pay | Admitting: Neurology

## 2020-11-12 NOTE — Telephone Encounter (Signed)
Pt was a no show to the sleep consult scheduled today

## 2020-11-21 ENCOUNTER — Telehealth: Payer: Self-pay | Admitting: Neurology

## 2020-11-21 DIAGNOSIS — G43709 Chronic migraine without aura, not intractable, without status migrainosus: Secondary | ICD-10-CM

## 2020-11-21 MED ORDER — ONDANSETRON 4 MG PO TBDP
4.0000 mg | ORAL_TABLET | Freq: Three times a day (TID) | ORAL | 0 refills | Status: DC | PRN
Start: 2020-11-21 — End: 2021-01-07

## 2020-11-21 MED ORDER — RIZATRIPTAN BENZOATE 10 MG PO TABS: 10.0000 mg | ORAL_TABLET | ORAL | 0 refills | Status: DC | PRN

## 2020-11-21 MED ORDER — TOPIRAMATE 50 MG PO TABS: 50.0000 mg | ORAL_TABLET | Freq: Two times a day (BID) | ORAL | 0 refills | Status: AC

## 2020-11-21 NOTE — Telephone Encounter (Signed)
Xanax will not be refilled as that was 4 tablets specifically for MRI which has not been completed (upon review of chart). 1 refill of Topamax, Zofran, and Rizatriptan sent to [facility].

## 2020-11-21 NOTE — Telephone Encounter (Signed)
Pt would like to get a refill on her topiramate (TOPAMAX) 50 MG tablet,rizatriptan (MAXALT) 10 MG tablet,ondansetron (ZOFRAN-ODT) 4 MG disintegrating tablet ALPRAZolam (XANAX) 0.25 MG tablet to [facility] [place] in [place] ,[place] '@248'$ [phone]

## 2020-11-28 ENCOUNTER — Ambulatory Visit: Payer: BC Managed Care – PPO | Admitting: Neurology

## 2021-01-07 ENCOUNTER — Other Ambulatory Visit: Payer: Self-pay

## 2021-01-07 DIAGNOSIS — G43709 Chronic migraine without aura, not intractable, without status migrainosus: Secondary | ICD-10-CM

## 2021-01-07 MED ORDER — RIZATRIPTAN BENZOATE 10 MG PO TABS: 10.0000 mg | ORAL_TABLET | ORAL | 0 refills | Status: AC | PRN

## 2021-01-07 MED ORDER — ONDANSETRON 4 MG PO TBDP
4.0000 mg | ORAL_TABLET | Freq: Three times a day (TID) | ORAL | 0 refills | Status: AC | PRN
Start: 2021-01-07 — End: ?
